# Patient Record
Sex: Male | Born: 1965 | Race: White | Hispanic: No | Marital: Single | Smoking: Current every day smoker
Health system: Southern US, Community
[De-identification: ages and names within clinical notes are randomized; demographics above are authoritative.]

## PROBLEM LIST (undated history)

## (undated) ENCOUNTER — Emergency Department (HOSPITAL_COMMUNITY): Discharge status: Against Medical Advice | Payer: 59

## (undated) DIAGNOSIS — C44300 Unspecified malignant neoplasm of skin of unspecified part of face: Secondary | ICD-10-CM

## (undated) DIAGNOSIS — Z789 Other specified health status: Secondary | ICD-10-CM

## (undated) HISTORY — PX: HERNIA REPAIR: SHX51

## (undated) HISTORY — PX: NO PAST SURGERIES: SHX2092

## (undated) HISTORY — DX: Other specified health status: Z78.9

## (undated) HISTORY — DX: Unspecified malignant neoplasm of skin of unspecified part of face: C44.300

---

## 2011-06-14 ENCOUNTER — Ambulatory Visit: Payer: Self-pay | Admitting: Physician Assistant

## 2011-06-14 ENCOUNTER — Ambulatory Visit: Payer: Self-pay | Admitting: Family Medicine

## 2011-06-14 ENCOUNTER — Ambulatory Visit: Payer: Self-pay

## 2011-06-14 VITALS — BP 132/78 | HR 60 | Temp 97.8°F | Resp 16 | Ht 70.5 in | Wt 185.8 lb

## 2011-06-14 DIAGNOSIS — M25529 Pain in unspecified elbow: Secondary | ICD-10-CM

## 2011-06-14 DIAGNOSIS — M7710 Lateral epicondylitis, unspecified elbow: Secondary | ICD-10-CM

## 2011-06-14 MED ORDER — MELOXICAM 7.5 MG PO TABS: ORAL_TABLET | ORAL | Status: DC

## 2011-06-14 MED ORDER — TENNIS ELBOW STRAP MISC: 1.0000 | Status: DC

## 2011-06-14 NOTE — Progress Notes (Signed)
Patient ID: Eric Snyder MRN: [id number], DOB: 12-15-1965, 46 y.o. Date of Encounter: 06/14/2011, 7:04 PM  Primary Physician: No primary provider on file.  Chief Complaint: Right elbow pain for 1 month  HPI: 46 y.o. year old male with history below presents with 1 month history of right elbow pain. Patient states 1 month prior he was attempting to place a motorcycle tire onto a rim and and had to strain to do so. The day after he was working on this motorcycle his right elbow was sore along the outside. Since this time he has had pain along the right lateral elbow if he goes from flexion to extension. He denies any numbness or tingling. He states that if he goes from flexion to extension the elbow will pop and become sore. He does however have full range of motion and full strength. He never had any swelling, erythema, or bruising. He is right handed. No pain proximally or distally. Has not tried anything to see if it helps. No prior injury to the affected joint.    No past medical history on file.   Home Meds: Prior to Admission medications   Not on File    Allergies: No Known Allergies  History   Social History  . Marital Status: Single    Spouse Name: N/A    Number of Children: N/A  . Years of Education: N/A   Occupational History  . Not on file.   Social History Main Topics  . Smoking status: Current Everyday Smoker -- 1.0 packs/day    Types: Cigarettes  . Smokeless tobacco: Not on file  . Alcohol Use: Not on file  . Drug Use: Not on file  . Sexually Active: Not on file   Other Topics Concern  . Not on file   Social History Narrative  . No narrative on file     Review of Systems: Constitutional: negative for chills, fever, night sweats, weight changes, or fatigue  HEENT: negative for vision changes, or hearing loss Cardiovascular: negative for chest pain or palpitations Respiratory: negative for hemoptysis, wheezing, shortness of breath, or cough Abdominal:  negative for abdominal pain, nausea, vomiting, diarrhea, or constipation Dermatological: negative for rash Neurologic: negative for headache, dizziness, or syncope All other systems reviewed and are otherwise negative with the exception to those above and in the HPI.   Physical Exam: Blood pressure 132/78, pulse 60, temperature 97.8 F (36.6 C), temperature source Oral, resp. rate 16, height 5' 10.5" (1.791 m), weight 185 lb 12.8 oz (84.278 kg)., Body mass index is 26.28 kg/(m^2). General: Well developed, well nourished, in no acute distress. Head: Normocephalic, atraumatic, eyes without discharge, sclera non-icteric, nares are without discharge.   Neck: Supple. No thyromegaly. Full ROM. No lymphadenopathy. Lungs: Clear bilaterally to auscultation without wheezes, rales, or rhonchi. Breathing is unlabored. Heart: RRR with S1 S2. No murmurs, rubs, or gallops appreciated. Msk:  Strength and tone normal for age. 5/5 strength bilateral upper extremities. Full flexion and extension of bilateral extremities without difficulty. No TTP along the olecranon, lateral epicondyle, or medial epicondyle. Although he does state that his discomfort is located along the lateral epicondyle. Brachial and radial pulses 2+ bilaterally. No TTP with squeeze. No TTP proximally or distally. Full sensation through out. No pop or clicks with flexion/extension. Pronation and supanation intact bilaterally.  Extremities/Skin: Warm and dry. No clubbing or cyanosis. No edema. No rashes or suspicious lesions. Neuro: Alert and oriented X 3. Moves all extremities spontaneously. Gait is normal. CNII-XII  grossly in tact. Psych:  Responds to questions appropriately with a normal affect.   [facility] reading (PRIMARY) by  Dr. Patsy Lager. negative  ASSESSMENT AND PLAN:  46 y.o. year old male with lateral epicondylitis  -Mobic 7.5 mg #60 1 po daily to bid prn RF 1 -Tennis elbow strap -RTC if symptoms persist   Signed, Eula Listen,  PA-C 06/14/2011 7:04 PM

## 2011-06-17 ENCOUNTER — Ambulatory Visit: Payer: Self-pay | Admitting: Family Medicine

## 2011-06-21 ENCOUNTER — Ambulatory Visit: Payer: Self-pay | Admitting: Physician Assistant

## 2012-12-15 ENCOUNTER — Encounter: Payer: Self-pay | Admitting: *Deleted

## 2012-12-19 ENCOUNTER — Ambulatory Visit (INDEPENDENT_AMBULATORY_CARE_PROVIDER_SITE_OTHER): Payer: Self-pay | Admitting: Internal Medicine

## 2012-12-19 VITALS — BP 122/82 | HR 66 | Temp 98.2°F | Resp 16 | Ht 70.5 in | Wt 183.2 lb

## 2012-12-19 DIAGNOSIS — K4090 Unilateral inguinal hernia, without obstruction or gangrene, not specified as recurrent: Secondary | ICD-10-CM

## 2012-12-19 MED ORDER — FLUTICASONE PROPIONATE 50 MCG/ACT NA SUSP: 2.0000 | Freq: Every day | NASAL | Status: DC

## 2012-12-20 NOTE — Progress Notes (Signed)
  Subjective:    Patient ID: Eric Snyder, male    DOB: 10-20-1965, 47 y.o.   MRN: [id number]  HPI Complaining of tenderness with bulging in his left lower quadrant for the last month or 2 Gets worse with lifting No testicular pain or GU symptoms Often tired Sleeps poorly/snores terribly/history of allergic problems with nasal swelling No history of asthma History of allergic rhinitis   Review of Systems No chest pain or palpitations     Objective:   Physical Exam BP 122/82  Pulse 66  Temp(Src) 98.2 F (36.8 C) (Oral)  Resp 16  Ht 5' 10.5" (1.791 m)  Wt 183 lb 3.2 oz (83.099 kg)  BMI 25.91 kg/m2  SpO2 98% Nares boggy with little airway No thyromegaly or lymphadenopathy Positive inguinal hernia on the left No testicular masses or tenderness Abdomen normal      Assessment & Plan:  Inguinal hernia - Plan: Ambulatory referral to General Surgery  fatigue secondary to poor sleep//allergic rhinitis by history--start with Flonase for one to 2 months/consider sleep study if not improved Discussed affordable Care act

## 2012-12-29 ENCOUNTER — Encounter (INDEPENDENT_AMBULATORY_CARE_PROVIDER_SITE_OTHER): Payer: Self-pay | Admitting: Surgery

## 2012-12-29 ENCOUNTER — Ambulatory Visit (INDEPENDENT_AMBULATORY_CARE_PROVIDER_SITE_OTHER): Payer: Self-pay | Admitting: Surgery

## 2012-12-29 ENCOUNTER — Telehealth (INDEPENDENT_AMBULATORY_CARE_PROVIDER_SITE_OTHER): Payer: Self-pay | Admitting: Surgery

## 2012-12-29 VITALS — BP 130/90 | HR 76 | Temp 98.2°F | Resp 15 | Ht 70.0 in | Wt 188.2 lb

## 2012-12-29 DIAGNOSIS — K4090 Unilateral inguinal hernia, without obstruction or gangrene, not specified as recurrent: Secondary | ICD-10-CM | POA: Insufficient documentation

## 2012-12-29 NOTE — Telephone Encounter (Signed)
Patient met with surgery scheduling, went over financial  responsibilities, patient will call back to schedule, face sheet placed in pending

## 2012-12-29 NOTE — Progress Notes (Signed)
Patient ID: Eric Snyder, male   DOB: 01-Oct-1965, 47 y.o.   MRN: [id number]  Chief Complaint  Patient presents with  . New Evaluation    eval [facility]    HPI Eric Snyder is a 47 y.o. male.  Patient referred by Dr. 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 He was coughing when he felt a tearing sensation in his left groin. He had minimal swelling at that time but since that occasion, he has had intermittent swelling and discomfort with a pulling sensation in his left groin. He was examined and was felt to have a left internal hernia. He is now referred for surgical evaluation.  History reviewed. No pertinent past medical history.  History reviewed. No pertinent past surgical history.  History reviewed. No pertinent family history.  Social History History  Substance Use Topics  . Smoking status: Current Every Day Smoker -- 1.00 packs/day    Types: Cigarettes  . Smokeless tobacco: Never Used  . Alcohol Use: No    No Known Allergies  No current outpatient prescriptions on file.   No current facility-administered medications for this visit.    Review of Systems Review of Systems  Constitutional: Negative for fever, chills and unexpected weight change.  HENT: Negative for congestion, hearing loss, sore throat, trouble swallowing and voice change.   Eyes: Negative for visual disturbance.  Respiratory: Negative for cough and wheezing.   Cardiovascular: Negative for chest pain, palpitations and leg swelling.  Gastrointestinal: Negative for nausea, vomiting, abdominal pain, diarrhea, constipation, blood in stool, abdominal distention, anal bleeding and rectal pain.  Genitourinary: Positive for scrotal swelling. Negative for hematuria and difficulty urinating.  Musculoskeletal: Negative for arthralgias.  Skin: Negative for rash and  wound.  Neurological: Negative for seizures, syncope, weakness and headaches.  Hematological: Negative for adenopathy. Does not bruise/bleed easily.  Psychiatric/Behavioral: Negative for confusion.    Blood pressure 130/90, pulse 76, temperature 98.2 F (36.8 C), temperature source Temporal, resp. rate 15, height 5\' 10"  (1.778 m), weight 188 lb 3.2 oz (85.367 kg).  Physical Exam Physical Exam WDWN in NAD HEENT:  EOMI, sclera anicteric Neck:  No masses, no thyromegaly Lungs:  CTA bilaterally; normal respiratory effort CV:  Regular rate and rhythm; no murmurs Abd:  +bowel sounds, soft, non-tender, no masses GU:  Bilateral descended testes with no testicular masses; left inguinal hernia that is easily reducible. No sign of righ inguinal hernia. Ext:  Well-perfused; no edema Skin:  Warm, dry; no sign of jaundice  Data Reviewed None  Assessment    Left inguinal hernia-reducible     Plan    Left inguinal hernia repair with mesh.  The surgical procedure has been discussed with the patient.  Potential risks, benefits, alternative treatments, and expected outcomes have been explained.  All of the patient's questions at this time have been answered.  The likelihood of reaching the patient's treatment goal is good.  The patient understand the proposed surgical procedure and wishes to proceed.         Danaya Geddis K. 12/29/2012, 12:28 PM

## 2013-03-14 ENCOUNTER — Other Ambulatory Visit (HOSPITAL_COMMUNITY): Payer: Self-pay | Admitting: Physical Medicine and Rehabilitation

## 2013-03-14 DIAGNOSIS — M545 Low back pain, unspecified: Secondary | ICD-10-CM

## 2013-03-22 ENCOUNTER — Ambulatory Visit (HOSPITAL_COMMUNITY)
Admission: RE | Admit: 2013-03-22 | Discharge: 2013-03-22 | Disposition: A | Discharge status: Home / Self Care | Payer: 59 | Source: Ambulatory Visit | Attending: Physical Medicine and Rehabilitation | Admitting: Physical Medicine and Rehabilitation

## 2013-03-22 DIAGNOSIS — D1779 Benign lipomatous neoplasm of other sites: Secondary | ICD-10-CM | POA: Insufficient documentation

## 2013-03-22 DIAGNOSIS — M545 Low back pain, unspecified: Secondary | ICD-10-CM

## 2013-03-22 DIAGNOSIS — M5126 Other intervertebral disc displacement, lumbar region: Secondary | ICD-10-CM | POA: Insufficient documentation

## 2013-10-16 ENCOUNTER — Encounter (HOSPITAL_COMMUNITY): Payer: Self-pay | Admitting: Emergency Medicine

## 2013-10-16 ENCOUNTER — Emergency Department (HOSPITAL_COMMUNITY)
Admission: EM | Admit: 2013-10-16 | Discharge: 2013-10-16 | Disposition: A | Discharge status: Home / Self Care | Payer: 59 | Attending: Emergency Medicine | Admitting: Emergency Medicine

## 2013-10-16 ENCOUNTER — Ambulatory Visit (INDEPENDENT_AMBULATORY_CARE_PROVIDER_SITE_OTHER): Payer: 59 | Admitting: Family Medicine

## 2013-10-16 ENCOUNTER — Emergency Department (HOSPITAL_COMMUNITY): Payer: 59

## 2013-10-16 VITALS — BP 122/82 | HR 66 | Temp 97.6°F | Resp 16 | Ht 70.25 in | Wt 185.8 lb

## 2013-10-16 DIAGNOSIS — G4489 Other headache syndrome: Secondary | ICD-10-CM | POA: Diagnosis not present

## 2013-10-16 DIAGNOSIS — R51 Headache: Secondary | ICD-10-CM | POA: Insufficient documentation

## 2013-10-16 DIAGNOSIS — R269 Unspecified abnormalities of gait and mobility: Secondary | ICD-10-CM

## 2013-10-16 DIAGNOSIS — F17200 Nicotine dependence, unspecified, uncomplicated: Secondary | ICD-10-CM | POA: Insufficient documentation

## 2013-10-16 DIAGNOSIS — Z79899 Other long term (current) drug therapy: Secondary | ICD-10-CM | POA: Diagnosis not present

## 2013-10-16 DIAGNOSIS — F29 Unspecified psychosis not due to a substance or known physiological condition: Secondary | ICD-10-CM

## 2013-10-16 DIAGNOSIS — R410 Disorientation, unspecified: Secondary | ICD-10-CM

## 2013-10-16 DIAGNOSIS — G4452 New daily persistent headache (NDPH): Secondary | ICD-10-CM

## 2013-10-16 LAB — CSF CELL COUNT WITH DIFFERENTIAL
RBC COUNT CSF: 310 /mm3 — AB
RBC Count, CSF: 1 /mm3 — ABNORMAL HIGH
Tube #: 1
Tube #: 3
WBC, CSF: 5 /mm3 (ref 0–5)
WBC, CSF: 6 /mm3 — ABNORMAL HIGH (ref 0–5)

## 2013-10-16 LAB — URINALYSIS, ROUTINE W REFLEX MICROSCOPIC
BILIRUBIN URINE: NEGATIVE
GLUCOSE, UA: NEGATIVE mg/dL
Hgb urine dipstick: NEGATIVE
Ketones, ur: NEGATIVE mg/dL
LEUKOCYTES UA: NEGATIVE
NITRITE: NEGATIVE
PH: 7 (ref 5.0–8.0)
Protein, ur: NEGATIVE mg/dL
SPECIFIC GRAVITY, URINE: 1.021 (ref 1.005–1.030)
Urobilinogen, UA: 1 mg/dL (ref 0.0–1.0)

## 2013-10-16 LAB — ETHANOL

## 2013-10-16 LAB — CBC WITH DIFFERENTIAL/PLATELET
Basophils Absolute: 0.1 10*3/uL (ref 0.0–0.1)
Basophils Relative: 1 % (ref 0–1)
Eosinophils Absolute: 0.7 10*3/uL (ref 0.0–0.7)
Eosinophils Relative: 6 % — ABNORMAL HIGH (ref 0–5)
HEMATOCRIT: 46.9 % (ref 39.0–52.0)
Hemoglobin: 16.3 g/dL (ref 13.0–17.0)
LYMPHS ABS: 3 10*3/uL (ref 0.7–4.0)
LYMPHS PCT: 24 % (ref 12–46)
MCH: 31.2 pg (ref 26.0–34.0)
MCHC: 34.8 g/dL (ref 30.0–36.0)
MCV: 89.7 fL (ref 78.0–100.0)
MONO ABS: 0.8 10*3/uL (ref 0.1–1.0)
MONOS PCT: 6 % (ref 3–12)
NEUTROS ABS: 8 10*3/uL — AB (ref 1.7–7.7)
Neutrophils Relative %: 63 % (ref 43–77)
Platelets: 229 10*3/uL (ref 150–400)
RBC: 5.23 MIL/uL (ref 4.22–5.81)
RDW: 13.1 % (ref 11.5–15.5)
WBC: 12.5 10*3/uL — AB (ref 4.0–10.5)

## 2013-10-16 LAB — RAPID URINE DRUG SCREEN, HOSP PERFORMED
AMPHETAMINES: NOT DETECTED
BARBITURATES: NOT DETECTED
BENZODIAZEPINES: NOT DETECTED
COCAINE: NOT DETECTED
Opiates: NOT DETECTED
Tetrahydrocannabinol: NOT DETECTED

## 2013-10-16 LAB — BASIC METABOLIC PANEL
Anion gap: 11 (ref 5–15)
BUN: 18 mg/dL (ref 6–23)
CHLORIDE: 102 meq/L (ref 96–112)
CO2: 27 meq/L (ref 19–32)
Calcium: 9.4 mg/dL (ref 8.4–10.5)
Creatinine, Ser: 1.03 mg/dL (ref 0.50–1.35)
GFR calc Af Amer: >90 mL/min (ref >90)
GFR calc non Af Amer: 85 mL/min — ABNORMAL LOW (ref >90)
Glucose, Bld: 84 mg/dL (ref 70–99)
Potassium: 4.3 mEq/L (ref 3.7–5.3)
Sodium: 140 mEq/L (ref 137–147)

## 2013-10-16 LAB — POCT CBC
Granulocyte percent: 60 % (ref 37–80)
HCT, POC: 47.3 % (ref 43.5–53.7)
Hemoglobin: 15.3 g/dL (ref 14.1–18.1)
Lymph, poc: 3.3 (ref 0.6–3.4)
MCH, POC: 29.4 pg (ref 27–31.2)
MCHC: 32.3 g/dL (ref 31.8–35.4)
MCV: 91.2 fL (ref 80–97)
MID (cbc): 0.5 (ref 0–0.9)
MPV: 7.9 fL (ref 0–99.8)
POC Granulocyte: 5.7 (ref 2–6.9)
POC LYMPH PERCENT: 34.9 % (ref 10–50)
POC MID %: 5.1 %M (ref 0–12)
Platelet Count, POC: 229 10*3/uL (ref 142–424)
RBC: 5.19 M/uL (ref 4.69–6.13)
RDW, POC: 13.8 %
WBC: 9.5 10*3/uL (ref 4.6–10.2)

## 2013-10-16 LAB — GRAM STAIN: Special Requests: NORMAL

## 2013-10-16 LAB — GLUCOSE, CSF: Glucose, CSF: 54 mg/dL (ref 43–76)

## 2013-10-16 LAB — PROTEIN, CSF: Total  Protein, CSF: 100 mg/dL — ABNORMAL HIGH (ref 15–45)

## 2013-10-16 LAB — POCT SEDIMENTATION RATE: POCT SED RATE: 7 mm/hr (ref 0–22)

## 2013-10-16 MED ORDER — HYDROMORPHONE HCL PF 1 MG/ML IJ SOLN
1.0000 mg | Freq: Once | INTRAMUSCULAR | Status: AC
  Administered 2013-10-16: 1 mg via INTRAVENOUS
  Filled 2013-10-16: qty 1

## 2013-10-16 MED ORDER — SODIUM CHLORIDE 0.9 % IV BOLUS (SEPSIS)
500.0000 mL | Freq: Once | INTRAVENOUS | Status: AC
  Administered 2013-10-16: 500 mL via INTRAVENOUS

## 2013-10-16 MED ORDER — SODIUM CHLORIDE 0.9 % IV SOLN
INTRAVENOUS | Status: DC
  Administered 2013-10-16: 19:00:00 via INTRAVENOUS

## 2013-10-16 MED ORDER — ONDANSETRON HCL 4 MG/2ML IJ SOLN
4.0000 mg | Freq: Once | INTRAMUSCULAR | Status: AC
  Administered 2013-10-16: 4 mg via INTRAVENOUS
  Filled 2013-10-16: qty 2

## 2013-10-16 MED ORDER — DIAZEPAM 5 MG/ML IJ SOLN
5.0000 mg | Freq: Once | INTRAMUSCULAR | Status: AC
  Administered 2013-10-16: 5 mg via INTRAVENOUS
  Filled 2013-10-16: qty 2

## 2013-10-16 NOTE — ED Provider Notes (Signed)
CSN: [id number]     Arrival date & time 10/16/13  1630 History   First MD Initiated Contact with Patient 10/16/13 1747     Chief Complaint  Patient presents with  . Headache     (Consider location/radiation/quality/duration/timing/severity/associated sxs/prior Treatment) Patient is a 48 y.o. male presenting with headaches. The history is provided by the patient and the spouse.  Headache  He presents for evaluation of headache that has been present almost constantly, for 6 days. The headache started after a bout of vomiting. First he had pain in his left neck, and then it radiated to his left hand, over the course of 2 days. Since then the pain has been persistent, and prevents him from doing things around the house, having sex;  and does not respond when he takes his medicines. He has had some chills and fever, but not taken his temperature. He has not had headaches like this previously. He denies neck pain currently. No chest pain, shortness of breath, cough, weakness or dizziness. He was seen earlier today at an urgent care, and sent here for evaluation. He denies known sick contacts. There are no other known modifying factors.  History reviewed. No pertinent past medical history. History reviewed. No pertinent past surgical history. History reviewed. No pertinent family history. History  Substance Use Topics  . Smoking status: Current Every Day Smoker -- 1.00 packs/day    Types: Cigarettes  . Smokeless tobacco: Never Used  . Alcohol Use: No    Review of Systems  Neurological: Positive for headaches.  All other systems reviewed and are negative.     Allergies  Review of patient's allergies indicates no known allergies.  Home Medications   Prior to Admission medications   Medication Sig Start Date End Date Taking? Authorizing Provider  Ascorbic Acid (VITAMIN C PO) Take 1 tablet by mouth daily.   Yes Historical Provider, MD  Cholecalciferol (VITAMIN D-3 PO) Take 1 tablet by  mouth daily.   Yes Historical Provider, MD  cyclobenzaprine (FLEXERIL) 10 MG tablet Take 10 mg by mouth at bedtime as needed for muscle spasms.   Yes Historical Provider, MD  Tapentadol HCl (NUCYNTA) 100 MG TABS Take 100 mg by mouth 2 (two) times daily as needed (pain).   Yes Historical Provider, MD  VITAMIN A PO Take 1 capsule by mouth daily.   Yes Historical Provider, MD   BP 150/103  Pulse 71  Temp(Src) 97.7 F (36.5 C) (Oral)  Resp 18  SpO2 97% Physical Exam  Nursing note and vitals reviewed. Constitutional: He is oriented to person, place, and time. He appears well-developed and well-nourished.  HENT:  Head: Normocephalic and atraumatic.  Right Ear: External ear normal.  Left Ear: External ear normal.  Mouth/Throat: No oropharyngeal exudate.  Eyes: Conjunctivae and EOM are normal. Pupils are equal, round, and reactive to light.  Neck: Normal range of motion and phonation normal. Neck supple.  No meningismus  Cardiovascular: Normal rate, regular rhythm, normal heart sounds and intact distal pulses.   Pulmonary/Chest: Effort normal and breath sounds normal. He exhibits no bony tenderness.  Abdominal: Soft. There is no tenderness.  Musculoskeletal: Normal range of motion.  Neurological: He is alert and oriented to person, place, and time. No cranial nerve deficit or sensory deficit. He exhibits normal muscle tone. Coordination normal.  No dysarthria, aphasia or nystagmus  Skin: Skin is warm, dry and intact.  Psychiatric: He has a normal mood and affect. His behavior is normal. Judgment and thought content  normal.    ED Course  LUMBAR PUNCTURE Date/Time: 10/16/2013 9:36 PM Performed by: Eulis Foster, Imoni Kohen L Authorized by: Richarda Blade Consent: Verbal consent obtained. written consent obtained. Risks and benefits: risks, benefits and alternatives were discussed Consent given by: patient Patient understanding: patient states understanding of the procedure being  performed Patient consent: the patient's understanding of the procedure matches consent given Procedure consent: procedure consent matches procedure scheduled Relevant documents: relevant documents present and verified Test results: test results available and properly labeled Site marked: the operative site was marked Imaging studies: imaging studies available Patient identity confirmed: verbally with patient, provided demographic data and hospital-assigned identification number Time out: Immediately prior to procedure a "time out" was called to verify the correct patient, procedure, equipment, support staff and site/side marked as required. Indications: evaluation for infection and evaluation for subarachnoid hemorrhage Anesthesia: local infiltration Local anesthetic: lidocaine 1% without epinephrine Anesthetic total: 3 ml Patient sedated: no Lumbar space: L3-L4 interspace Patient's position: sitting Needle gauge: 20 Needle type: diamond point Needle length: 3.5 in Number of attempts: 1 Fluid appearance: clear Tubes of fluid: 3 Total volume: 3 ml Post-procedure: site cleaned and adhesive bandage applied Patient tolerance: Patient tolerated the procedure well with no immediate complications. Comments: Transient right leg stinger on needle insertion into canal. Slow flow.   (including critical care time) Medications  0.9 %  sodium chloride infusion ( Intravenous New Bag/Given 10/16/13 1845)  sodium chloride 0.9 % bolus 500 mL (0 mLs Intravenous Stopped 10/16/13 2106)  HYDROmorphone (DILAUDID) injection 1 mg (1 mg Intravenous Given 10/16/13 1845)  diazepam (VALIUM) injection 5 mg (5 mg Intravenous Given 10/16/13 1845)  ondansetron (ZOFRAN) injection 4 mg (4 mg Intravenous Given 10/16/13 1845)    Patient Vitals for the past 24 hrs:  BP Temp Temp src Pulse Resp SpO2  10/16/13 1941 158/107 mmHg - - 69 12 98 %  10/16/13 1815 150/90 mmHg - - 58 14 98 %  10/16/13 1634 150/103 mmHg 97.7 F  (36.5 C) Oral 71 18 97 %       11:06 PM Reevaluation with update and discussion. After initial assessment and treatment, an updated evaluation reveals he continues to be very anxious, and had trouble, waiting for the test results to return. When the results return. I explained the results to the patient and his wife. They understand that the only significant abnormality on the lumbar puncture was elevated protein. This is a nonspecific finding and in this case does not indicate an infectious meningitis. Kismet Facemire L     Labs Review Labs Reviewed  CBC WITH DIFFERENTIAL  BASIC METABOLIC PANEL  URINALYSIS, ROUTINE W REFLEX MICROSCOPIC  URINE RAPID DRUG SCREEN (HOSP PERFORMED)  ETHANOL    Imaging Review Ct Head Wo Contrast  10/16/2013   CLINICAL DATA:  Headache and dizziness  EXAM: CT HEAD WITHOUT CONTRAST  TECHNIQUE: Contiguous axial images were obtained from the base of the skull through the vertex without intravenous contrast.  COMPARISON:  None.  FINDINGS: The ventricles are normal in size and configuration. There is no mass, hemorrhage, extra-axial fluid collection, midline shift. Gray-white compartments are normal. There is no demonstrable acute infarct. Bony calvarium appears intact. The mastoid air cells are clear. There is paranasal sinus disease in the right maxillary antrum. There is leftward deviation of the nasal septum.  IMPRESSION: Right maxillary sinus disease. No intracranial mass, hemorrhage, extra-axial fluid collection, or focal gray -white compartment lesion. No acute infarct apparent.   Electronically Signed   By:  Lowella Grip M.D.   On: 10/16/2013 17:22     EKG Interpretation None      MDM   Final diagnoses:  Other headache syndrome    Nonspecific headache, with ED evaluation, not indicating presence of intracranial bleeding, viral meningitis, bacterial meningitis, or acute brain lesion.  Nursing Notes Reviewed/ Care Coordinated Applicable Imaging  Reviewed Interpretation of Laboratory Data incorporated into ED treatment  The patient appears reasonably screened and/or stabilized for discharge and I doubt any other medical condition or other [facility] requiring further screening, evaluation, or treatment in the ED at this time prior to discharge.  Plan: Home Medications- usual; Home Treatments- rest; return here if the recommended treatment, does not improve the symptoms; Recommended follow up- PCP f/u prn    Richarda Blade, MD 10/16/13 2308

## 2013-10-16 NOTE — ED Notes (Signed)
Security at bedside to calm patient and assist with discharge.

## 2013-10-16 NOTE — ED Notes (Signed)
Pt yelling out at nurses station. States "I never am in the hospital, I never am sick, I don't want to be here take these damn wires off of me" All monitors taken off patient per his request to calm him down from yelling and cursing at his wife and staff.

## 2013-10-16 NOTE — Progress Notes (Signed)
Chief Complaint:  Chief Complaint  Patient presents with  . Headache    Pt. has a terrible headache in the middle of his head, x6 days, balance is off while going down the stairs but not up stairs  . Neck Pain    HPI: Eric Snyder is a 48 y.o. male who is here for a 6 day history of worsening HA,  Started in the back of his head on the left side, he states he had eaten a salad and had some nausea and vomited x 1 . The intense vomiting may have pulled the back of his neck muscle and he thought that was where his pain came from and it would go away. However the pain is now constant , intense and severe with minimal exertion. For the first 2 days he was having pain and felt there was a little bubble and it really would sting around his occiput  and now it is behind his ear and also on his left side pf his temples , he currently has pain with any type of exertion whether it is climbing up stairs or having sex. He states that it starts at the base of his skull and then radiates to the left temporal and front part of his skull. He has had chills and Fever Tmax 101 , fever last night and 2 days before that. He has pressure in his head, he feels like something is in his head. Per his wife he has had some numbness and tingling down his right arm, also he has had some confusion ,he does not remember specifics, he has had some blurred vision.  He feels somethng burst behind his left ear drum.  He feels somethng burst behind his left ear drum. He deneis any recent history of migraine HA, he had them as a child.  He has some blurred vision. No double vision. Some light and noise sensitivity He states he has some numbenss and tinglin on right arm when he was sleeping on it and also when he was driving.  HE denies dizziness, deneis CP, SOB.  He does not have a hsitory of hyperlipidemia, HTN or DM. He is a 1 ppd x 20 year smoker No prior hx of MI/CVA  History reviewed. No pertinent past medical  history. History reviewed. No pertinent past surgical history. History   Social History  . Marital Status: Married    Spouse Name: N/A    Number of Children: N/A  . Years of Education: N/A   Social History Main Topics  . Smoking status: Current Every Day Smoker -- 1.00 packs/day    Types: Cigarettes  . Smokeless tobacco: Never Used  . Alcohol Use: No  . Drug Use: No  . Sexual Activity: None   Other Topics Concern  . None   Social History Narrative  . None   History reviewed. No pertinent family history. No Known Allergies Prior to Admission medications   Not on File     ROS: The patient denies night sweats, unintentional weight loss, chest pain, palpitations, wheezing, dyspnea on exertion,  vomiting, abdominal pain, dysuria, hematuria, melena, + numbness, weakness, or tingling.   All other systems have been reviewed and were otherwise negative with the exception of those mentioned in the HPI and as above.    PHYSICAL EXAM: Filed Vitals:   10/16/13 1346  BP: 122/82  Pulse: 66  Temp: 97.6 F (36.4 C)  Resp: 16   Filed Vitals:  10/16/13 1346  Height: 5' 10.25" (1.784 m)  Weight: 185 lb 12.8 oz (84.278 kg)   Body mass index is 26.48 kg/(m^2).  General: Alert, mild distress HEENT:  Normocephalic, atraumatic, oropharynx patent. EOMI, PERRLA, fundo is normal, tm normal; his eyes are closed. + tenderness along left occiput and also left temporal area Cardiovascular:  Regular rate and rhythm, no rubs murmurs or gallops.  No Carotid bruits, radial pulse intact. No pedal edema.  Respiratory: Clear to auscultation bilaterally.  No wheezes, rales, or rhonchi.  No cyanosis, no use of accessory musculature GI: No organomegaly, abdomen is soft and non-tender, positive bowel sounds.  No masses. Skin: No rashes. Neurologic: Facial musculature symmetric. He has 5/5 strength UE and Mcclellan Demarais . Neg meningeal signs.  Psychiatric: Patient is appropriate throughout our  interaction. Lymphatic: No cervical lymphadenopathy Musculoskeletal: Gait is intact Neck-normal AROM, neg spurling Shoulder exam WNL   LABS: Results for orders placed in visit on 10/16/13  POCT CBC      Result Value Ref Range   WBC 9.5  4.6 - 10.2 K/uL   Lymph, poc 3.3  0.6 - 3.4   POC LYMPH PERCENT 34.9  10 - 50 %L   MID (cbc) 0.5  0 - 0.9   POC MID % 5.1  0 - 12 %M   POC Granulocyte 5.7  2 - 6.9   Granulocyte percent 60.0  37 - 80 %G   RBC 5.19  4.69 - 6.13 M/uL   Hemoglobin 15.3  14.1 - 18.1 g/dL   HCT, POC 47.3  43.5 - 53.7 %   MCV 91.2  80 - 97 fL   MCH, POC 29.4  27 - 31.2 pg   MCHC 32.3  31.8 - 35.4 g/dL   RDW, POC 13.8     Platelet Count, POC 229  142 - 424 K/uL   MPV 7.9  0 - 99.8 fL     EKG/XRAY:   Primary read interpreted by Dr. Marin Comment at [facility].   ASSESSMENT/PLAN: Encounter Diagnoses  Name Primary?  . New daily persistent headache Yes  . Intermittent confusion   . Gait abnormality    48 y/o male with no significant PMH except 1 ppd tobacco user for 20 years with worsening occipital HA radiating to temporal lobe  Etiology ? Atypical migraine vs  neck strain vs CVA/anuerysm vs possible temporal arteritis He has neuro sxs that need to be evaluated with worsening HA and some confusion and gait changes Since he is stoic per wife and never goes to the doctor this was serious enough for him to come in, we attempted to get imaging to ruleout CVA/aneurysm but was unsuccessful Labs pending Go to ER for further evaluation  F/u prn    Gross sideeffects, risk and benefits, and alternatives of medications d/w patient. Patient is aware that all medications have potential sideeffects and we are unable to predict every sideeffect or drug-drug interaction that may occur.  Carla Whilden, [place], DO 10/16/2013 3:16 PM

## 2013-10-16 NOTE — ED Notes (Signed)
The patient is unable to give a urine specimen at this time. 

## 2013-10-16 NOTE — ED Notes (Signed)
Pt screaming and yelling at nurse and wife. States he is going home, he "does not have meningitis" and wants to go. Pt rambling and irate. Pt IV removed per pt request.

## 2013-10-16 NOTE — Discharge Instructions (Signed)
Follow up with your doctor for further evaluation of the headache. The testing today; did not indicate meningitis, bleeding in your head or suggest any signs of aneurysms, in the brain.   General Headache Without Cause A headache is pain or discomfort felt around the head or neck area. The specific cause of a headache may not be found. There are many causes and types of headaches. A few common ones are:  Tension headaches.  Migraine headaches.  Cluster headaches.  Chronic daily headaches. HOME CARE INSTRUCTIONS   Keep all follow-up appointments with your caregiver or any specialist referral.  Only take over-the-counter or prescription medicines for pain or discomfort as directed by your caregiver.  Lie down in a dark, quiet room when you have a headache.  Keep a headache journal to find out what may trigger your migraine headaches. For example, write down:  What you eat and drink.  How much sleep you get.  Any change to your diet or medicines.  Try massage or other relaxation techniques.  Put ice packs or heat on the head and neck. Use these 3 to 4 times per day for 15 to 20 minutes each time, or as needed.  Limit stress.  Sit up straight, and do not tense your muscles.  Quit smoking if you smoke.  Limit alcohol use.  Decrease the amount of caffeine you drink, or stop drinking caffeine.  Eat and sleep on a regular schedule.  Get 7 to 9 hours of sleep, or as recommended by your caregiver.  Keep lights dim if bright lights bother you and make your headaches worse. SEEK MEDICAL CARE IF:   You have problems with the medicines you were prescribed.  Your medicines are not working.  You have a change from the usual headache.  You have nausea or vomiting. SEEK IMMEDIATE MEDICAL CARE IF:   Your headache becomes severe.  You have a fever.  You have a stiff neck.  You have loss of vision.  You have muscular weakness or loss of muscle control.  You start  losing your balance or have trouble walking.  You feel faint or pass out.  You have severe symptoms that are different from your first symptoms. MAKE SURE YOU:   Understand these instructions.  Will watch your condition.  Will get help right away if you are not doing well or get worse. Document Released: 02/08/2005 Document Revised: 05/03/2011 Document Reviewed: 02/24/2011 [facility] Patient Information 2015 [place], [place]. This information is not intended to replace advice given to you by your health care provider. Make sure you discuss any questions you have with your health care provider.

## 2013-10-16 NOTE — ED Notes (Addendum)
Pt reports straining to have a bowel movement 5-6 days ago and felt "something pop" at the base of his skull, now has pain to left side of head and blurred vision left eye. Denies any n/v. Pt went to [facility], had labs drawn and then sent here.

## 2013-10-16 NOTE — ED Notes (Signed)
Pt very anxious, speaking angrily about how his head hurts. Pt is also upset because he cannot have sex, states every time he tries his head hurts.

## 2013-10-17 ENCOUNTER — Telehealth: Payer: Self-pay

## 2013-10-17 ENCOUNTER — Telehealth: Payer: Self-pay | Admitting: Family Medicine

## 2013-10-17 LAB — COMPLETE METABOLIC PANEL WITHOUT GFR
AST: 22 U/L (ref 0–37)
Calcium: 9.4 mg/dL (ref 8.4–10.5)
Chloride: 104 meq/L (ref 96–112)
Creat: 0.96 mg/dL (ref 0.50–1.35)
Potassium: 4.4 meq/L (ref 3.5–5.3)

## 2013-10-17 LAB — COMPLETE METABOLIC PANEL WITH GFR
ALT: 21 U/L (ref 0–53)
Albumin: 4.4 g/dL (ref 3.5–5.2)
Alkaline Phosphatase: 50 U/L (ref 39–117)
BUN: 19 mg/dL (ref 6–23)
CO2: 26 mEq/L (ref 19–32)
GFR, Est African American: >89 mL/min
GFR, Est Non African American: >89 mL/min
Glucose, Bld: 88 mg/dL (ref 70–99)
Sodium: 138 mEq/L (ref 135–145)
Total Bilirubin: 0.4 mg/dL (ref 0.2–1.2)
Total Protein: 6.7 g/dL (ref 6.0–8.3)

## 2013-10-17 NOTE — Telephone Encounter (Signed)
Patient is returning dr Marin Comment call from earlier today

## 2013-10-17 NOTE — Telephone Encounter (Signed)
LM about labs , apparently when he got into Er they did a spinal tap. CT scan was negative for  CVA/lesions. He has right max sinus diseases. He was also given valium, dilaudid.

## 2013-10-18 NOTE — Telephone Encounter (Signed)
LM for pt- advised scan was normal. Rtn call if any questions.

## 2013-10-19 ENCOUNTER — Ambulatory Visit (INDEPENDENT_AMBULATORY_CARE_PROVIDER_SITE_OTHER): Payer: 59 | Admitting: Internal Medicine

## 2013-10-19 VITALS — BP 126/80 | HR 67 | Temp 98.2°F | Resp 17 | Ht 71.0 in | Wt 187.0 lb

## 2013-10-19 DIAGNOSIS — K089 Disorder of teeth and supporting structures, unspecified: Secondary | ICD-10-CM

## 2013-10-19 DIAGNOSIS — K0889 Other specified disorders of teeth and supporting structures: Secondary | ICD-10-CM

## 2013-10-19 DIAGNOSIS — G4452 New daily persistent headache (NDPH): Secondary | ICD-10-CM

## 2013-10-19 DIAGNOSIS — Z8349 Family history of other endocrine, nutritional and metabolic diseases: Secondary | ICD-10-CM

## 2013-10-19 DIAGNOSIS — J3089 Other allergic rhinitis: Secondary | ICD-10-CM

## 2013-10-19 MED ORDER — PREDNISONE 20 MG PO TABS: ORAL_TABLET | ORAL | Status: DC

## 2013-10-19 NOTE — Progress Notes (Signed)
   Subjective:    Patient ID: Eric Snyder, male    DOB: [birth year], 48 y.o.   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 Authorizing Provider  Ascorbic Acid (VITAMIN C PO) Take 1 tablet by mouth daily.   Yes Historical Provider, MD  Cholecalciferol (VITAMIN D-3 PO) Take 1 tablet by mouth daily.   Yes Historical Provider, MD  cyclobenzaprine (FLEXERIL) 10 MG tablet Take 10 mg by mouth at bedtime as needed for muscle spasms.   Yes Historical Provider, MD  VITAMIN A PO Take 1 capsule by mouth daily.   Yes Historical Provider, MD  NUCYNTA-   -Dr Marjean Donna rupture and bulging   Review of Systems No fever chills sweats No vision changes No sob No paresthias No confusion Not dizzy    Objective:   Physical Exam BP 126/80  Pulse 67  Temp(Src) 98.2 F (36.8 C) (Oral)  Resp 17  Ht 5\' 11"  (1.803 m)  Wt 187 lb (84.823 kg)  BMI 26.09 kg/m2  SpO2 98%  HEENT: PERRLA/EOMs conj  nose clear x boggy turbs Lungs clear Ht reg w/out M CN 2-12 intact DTRs symmetr No sens or motor losses Romberg negative Gait nl     Assessment & Plan:  New daily persistent headache  Family history of hemochromatosis - Plan: Hemochromatosis DNA-PCR(c282y,h63d)  Other allergic rhinitis  Pain, dental  The cause of his symptoms related to his occupational exposures and no signs of at least one sinus area with fluid on the CT it would be prudent to treat aggressively for allergies and see any effect on his  headache He had a round of antibiotics from the dentist and so this will not be repeated The dental injections may have played a role  Meds ordered this encounter  Medications  . predniSONE (DELTASONE) 20 MG tablet    Sig: 3/3/2/2/1/1 single daily dose for 6 days    Dispense:  12 tablet    Refill:  0   OTC antihistamines/decongestants

## 2013-10-20 LAB — CSF CULTURE W GRAM STAIN

## 2013-10-20 LAB — CSF CULTURE: CULTURE: NO GROWTH

## 2013-10-24 LAB — HEMOCHROMATOSIS DNA-PCR(C282Y,H63D)

## 2013-10-25 ENCOUNTER — Encounter: Payer: Self-pay | Admitting: Internal Medicine

## 2014-01-22 ENCOUNTER — Other Ambulatory Visit: Payer: Self-pay

## 2014-01-30 ENCOUNTER — Ambulatory Visit: Payer: 59

## 2014-01-30 ENCOUNTER — Ambulatory Visit (INDEPENDENT_AMBULATORY_CARE_PROVIDER_SITE_OTHER): Payer: 59

## 2014-01-30 DIAGNOSIS — Z23 Encounter for immunization: Secondary | ICD-10-CM

## 2014-10-08 ENCOUNTER — Telehealth: Payer: Self-pay

## 2014-10-08 ENCOUNTER — Ambulatory Visit (INDEPENDENT_AMBULATORY_CARE_PROVIDER_SITE_OTHER): Payer: 59 | Admitting: Internal Medicine

## 2014-10-08 VITALS — BP 128/74 | HR 73 | Temp 98.6°F | Resp 18 | Ht 71.0 in | Wt 186.0 lb

## 2014-10-08 DIAGNOSIS — K4090 Unilateral inguinal hernia, without obstruction or gangrene, not specified as recurrent: Secondary | ICD-10-CM

## 2014-10-08 NOTE — Progress Notes (Signed)
   Subjective:  This chart was scribed for Eric Lin, MD by [facility], medical scribe at [facility].The patient was seen in exam room 14 and the patient's care was started at 5:54 PM.   Patient ID: Eric Snyder, male    DOB: August 20, 1965, 49 y.o.   MRN: [id number] Chief Complaint  Patient presents with  . Referral    hernia   HPI HPI Comments: Eric Snyder is a 49 y.o. male who presents to [facility] for a referral for a hernia. He was previously referred to Dr. Georgette Dover in 2014 and he would like to be referred to him again.   History reviewed. No pertinent past medical history. Prior to Admission medications   Medication Sig Start Date End Date Taking? Authorizing Provider  Ascorbic Acid (VITAMIN C PO) Take 1 tablet by mouth daily.   Yes Historical Provider, MD  Cholecalciferol (VITAMIN D-3 PO) Take 1 tablet by mouth daily.   Yes Historical Provider, MD  cyclobenzaprine (FLEXERIL) 10 MG tablet Take 10 mg by mouth at bedtime as needed for muscle spasms.   Yes Historical Provider, MD  Diclofenac Sodium (VOLTAREN PO) Take by mouth.   Yes Historical Provider, MD  Tapentadol HCl (NUCYNTA) 100 MG TABS Take 100 mg by mouth 2 (two) times daily as needed (pain).   Yes Historical Provider, MD  VITAMIN A PO Take 1 capsule by mouth daily.   Yes Historical Provider, MD  predniSONE (DELTASONE) 20 MG tablet 3/3/2/2/1/1 single daily dose for 6 days Patient not taking: Reported on 10/08/2014 10/19/13   Leandrew Koyanagi, MD   No Known Allergies  Review of Systems     Objective:  BP 128/74 mmHg  Pulse 73  Temp(Src) 98.6 F (37 C)  Resp 18  Ht 5\' 11"  (1.803 m)  Wt 186 lb (84.369 kg)  BMI 25.95 kg/m2  SpO2 98% Physical Exam  Constitutional: He is oriented to person, place, and time. He appears well-developed and well-nourished. No distress.  HENT:  Head: Normocephalic and atraumatic.  Eyes: Pupils are equal, round, and reactive to light.    Neck: Normal range of motion.  Cardiovascular: Normal rate and regular rhythm.   Pulmonary/Chest: Effort normal. No respiratory distress.  Genitourinary:  L inguinal bulge above the ligament //tender /reducible/larger than past exam  Musculoskeletal: Normal range of motion.  Neurological: He is alert and oriented to person, place, and time.  Skin: Skin is warm and dry.  Psychiatric: He has a normal mood and affect. His behavior is normal.  Nursing note and vitals reviewed.     Assessment & Plan:  Inguinal hernia without obstruction or gangrene, recurrence not specified, unspecified laterality - Plan: Ambulatory referral to General Surgery     I have completed the patient encounter in its entirety as documented by the scribe, with editing by me where necessary. Akiah Bauch P. Laney Pastor, M.D.

## 2014-10-08 NOTE — Telephone Encounter (Signed)
Patient's wife called to ask if a referral could be made for her husband regarding his hernia.  He was referred in 2014 to Dr. Georgette Dover, and he would like to be referred to him again.  I advised his wife that he may need to come in to be seen since it has been a while since he's been to our office.  Please advise.  CB#: [phone]

## 2014-10-08 NOTE — Telephone Encounter (Signed)
LMOM to RTC for eval for referral.

## 2014-10-09 ENCOUNTER — Encounter: Payer: Self-pay | Admitting: General Surgery

## 2014-10-10 ENCOUNTER — Other Ambulatory Visit: Payer: Self-pay

## 2014-10-16 ENCOUNTER — Ambulatory Visit (INDEPENDENT_AMBULATORY_CARE_PROVIDER_SITE_OTHER): Payer: 59 | Admitting: General Surgery

## 2014-10-16 ENCOUNTER — Encounter: Payer: Self-pay | Admitting: General Surgery

## 2014-10-16 VITALS — BP 125/81 | HR 64 | Temp 98.1°F | Ht 70.0 in | Wt 187.0 lb

## 2014-10-16 DIAGNOSIS — K4000 Bilateral inguinal hernia, with obstruction, without gangrene, not specified as recurrent: Secondary | ICD-10-CM | POA: Diagnosis not present

## 2014-10-16 DIAGNOSIS — K4020 Bilateral inguinal hernia, without obstruction or gangrene, not specified as recurrent: Secondary | ICD-10-CM | POA: Insufficient documentation

## 2014-10-16 NOTE — Progress Notes (Signed)
Surgical Consultation  10/16/2014  [name] is an 49 y.o. male.   Chief Complaint  Patient presents with  . Other    Consult-Right Inguinal Hernia     HPI: 49 year old male presents to clinic for evaluation of groin pain. He states that he's been having left groin pain for approximately 18 months that has been progressively getting worse. Torsed for certain positions and after having a stressful or active active day. Tries the pain as a dull pressure that is constant. It does not probably does not stab. Sometimes it shoots down into the scrotum and on occasion he feels it on the opposite side. He states that he has on occasion noticed a bulge in his left groin that he has had to reduce. He has never noticed a bulge on his right side. The bulge has never been stuck out, it has always reduced on its own when he lays flat. He denies any fevers, chills, sweats, shortness of breath, chest pain, nausea, vomiting, constipation, diarrhea. Outside of this groin pain he states his only other problem is his back pain for which she sees a back specialist for.  Past Medical History  Diagnosis Date  . Right inguinal hernia     History reviewed. No pertinent past surgical history.  History reviewed. No pertinent family history.  Social History:  reports that he has been smoking Cigarettes.  He has a 30 pack-year smoking history. He has never used smokeless tobacco. He reports that he does not drink alcohol or use illicit drugs.  Allergies: No Known Allergies  Medications reviewed.     ROS multisystem review of systems was completed. All pertinent positives and negatives were within the history of present illness the remainder were negative.     BP 125/81 mmHg  Pulse 64  Temp(Src) 98.1 F (36.7 C) (Oral)  Ht 5\' 10"  (1.778 m)  Wt 84.823 kg (187 lb)  BMI 26.83 kg/m2  Physical Exam  Constitutional: He is oriented to person, place, and time and well-developed, well-nourished, and in  no distress.  HENT:  Head: Normocephalic and atraumatic.  Eyes: Conjunctivae are normal. Pupils are equal, round, and reactive to light.  Neck: Normal range of motion.  Cardiovascular: Normal rate, regular rhythm and normal heart sounds.   Pulmonary/Chest: Effort normal and breath sounds normal.  Abdominal: Soft. Bowel sounds are normal. He exhibits no distension. There is tenderness. A hernia is present. Hernia confirmed positive in the right inguinal area and confirmed positive in the left inguinal area.  Genitourinary: Penis normal.  Musculoskeletal: Normal range of motion.  Neurological: He is alert and oriented to person, place, and time. Gait normal.  Skin: Skin is warm and dry.  Psychiatric: Affect and judgment normal.      No results found for this or any previous visit (from the past 48 hour(s)). No results found.  Assessment/Plan: 1. Bilateral inguinal hernia with obstruction and without gangrene, recurrence not specified 49 year old male with bilateral internal hernias. Both of which are soft and easily reducible. Patient was offered open versus laparoscopic bilateral inguinal hernia repairs. The benefits risks and alternatives for both were discussed in detail and the patient voiced understanding. Patient looks undergo a laparoscopic bilateral hernia repair, scheduled for Thursday, September 8.  I have explained the procedure, risks, and aftercare of inguinal hernia repair to Eric Snyder.   Risks include but are not limited to bleeding, infection, wound problems, anesthesia, recurrence, bladder or intestine injury, urinary retention, testicular dysfunction, chronic pain, mesh problems.  He  seems to understand and agrees to proceed.  Questions were answered to his stated satisfaction.    [name], MD [facility] General Surgeon [facility] 10/16/2014

## 2014-10-16 NOTE — Patient Instructions (Signed)
Our surgery coordinator will be contacting you to go over your surgery.

## 2014-10-17 ENCOUNTER — Other Ambulatory Visit: Payer: Self-pay | Admitting: General Surgery

## 2014-10-18 ENCOUNTER — Telehealth: Payer: Self-pay | Admitting: General Surgery

## 2014-10-18 NOTE — Telephone Encounter (Signed)
Pt advised of pre op date/time and sx date. Sx: 10/31/14 with Dr Samuel Germany bilateral ing hernia repair Pre op: 10/24/14 bw 1-5.--phone.

## 2014-10-24 ENCOUNTER — Encounter: Payer: Self-pay | Admitting: *Deleted

## 2014-10-24 ENCOUNTER — Other Ambulatory Visit: Payer: 59

## 2014-10-24 NOTE — Patient Instructions (Signed)
  Your procedure is scheduled on: 10-31-14 Report to [place]. To find out your arrival time please call [phone] between 1PM - 3PM on 10-30-14  Remember: Instructions that are not followed completely may result in serious medical risk, up to and including death, or upon the discretion of your surgeon and anesthesiologist your surgery may need to be rescheduled.    _X___ 1. Do not eat food or drink liquids after midnight. No gum chewing or hard candies.     _X___ 2. No Alcohol for 24 hours before or after surgery.   ____ 3. Bring all medications with you on the day of surgery if instructed.    ____ 4. Notify your doctor if there is any change in your medical condition     (cold, fever, infections).     Do not wear jewelry, make-up, hairpins, clips or nail polish.  Do not wear lotions, powders, or perfumes. You may wear deodorant.  Do not shave 48 hours prior to surgery. Men may shave face and neck.  Do not bring valuables to the hospital.    [facility] is not responsible for any belongings or valuables.               Contacts, dentures or bridgework may not be worn into surgery.  Leave your suitcase in the car. After surgery it may be brought to your room.  For patients admitted to the hospital, discharge time is determined by your treatment team.   Patients discharged the day of surgery will not be allowed to drive home.   Please read over the following fact sheets that you were given:    ____ Take these medicines the morning of surgery with A SIP OF WATER:    1. NONE  2.   3.   4.  5.  6.  ____ Fleet Enema (as directed)   ____ Use CHG Soap as directed  ____ Use inhalers on the day of surgery  ____ Stop metformin 2 days prior to surgery    ____ Take 1/2 of usual insulin dose the night before surgery and none on the morning of surgery.   ____ Stop Coumadin/Plavix/aspirin-N/A  _X___ Stop Anti-inflammatories-STOP DICLOFENAC 7 DAYS  PRIOR TO SURGERY-NO NSAIDS OR ASA PRODUCTS-NUCYNTA/TYLENOL OK   ____ Stop supplements until after surgery.    ____ Bring C-Pap to the hospital.

## 2014-10-31 ENCOUNTER — Telehealth: Payer: Self-pay | Admitting: General Surgery

## 2014-10-31 ENCOUNTER — Ambulatory Visit: Payer: 59 | Admitting: Anesthesiology

## 2014-10-31 ENCOUNTER — Encounter
Admission: RE | Disposition: A | Discharge status: Home / Self Care | Payer: Self-pay | Source: Ambulatory Visit | Attending: General Surgery

## 2014-10-31 ENCOUNTER — Encounter: Payer: Self-pay | Admitting: *Deleted

## 2014-10-31 ENCOUNTER — Ambulatory Visit
Admission: RE | Admit: 2014-10-31 | Discharge: 2014-10-31 | Disposition: A | Discharge status: Home / Self Care | Payer: 59 | Source: Ambulatory Visit | Attending: General Surgery | Admitting: General Surgery

## 2014-10-31 DIAGNOSIS — R1030 Lower abdominal pain, unspecified: Secondary | ICD-10-CM | POA: Insufficient documentation

## 2014-10-31 DIAGNOSIS — D176 Benign lipomatous neoplasm of spermatic cord: Secondary | ICD-10-CM | POA: Insufficient documentation

## 2014-10-31 DIAGNOSIS — F17210 Nicotine dependence, cigarettes, uncomplicated: Secondary | ICD-10-CM | POA: Insufficient documentation

## 2014-10-31 DIAGNOSIS — K4000 Bilateral inguinal hernia, with obstruction, without gangrene, not specified as recurrent: Secondary | ICD-10-CM

## 2014-10-31 DIAGNOSIS — K4020 Bilateral inguinal hernia, without obstruction or gangrene, not specified as recurrent: Secondary | ICD-10-CM | POA: Diagnosis present

## 2014-10-31 HISTORY — PX: INGUINAL HERNIA REPAIR: SHX194

## 2014-10-31 SURGERY — REPAIR, HERNIA, INGUINAL, LAPAROSCOPIC
Anesthesia: General | Laterality: Bilateral | Wound class: Clean

## 2014-10-31 MED ORDER — FENTANYL CITRATE (PF) 100 MCG/2ML IJ SOLN
INTRAMUSCULAR | Status: DC | PRN
  Administered 2014-10-31: 50 ug via INTRAVENOUS
  Administered 2014-10-31 (×2): 100 ug via INTRAVENOUS
  Administered 2014-10-31 (×2): 25 ug via INTRAVENOUS

## 2014-10-31 MED ORDER — ROCURONIUM BROMIDE 100 MG/10ML IV SOLN
INTRAVENOUS | Status: DC | PRN
  Administered 2014-10-31: 5 mg via INTRAVENOUS
  Administered 2014-10-31: 30 mg via INTRAVENOUS
  Administered 2014-10-31: 20 mg via INTRAVENOUS
  Administered 2014-10-31: 10 mg via INTRAVENOUS

## 2014-10-31 MED ORDER — LIDOCAINE HCL (CARDIAC) 20 MG/ML IV SOLN
INTRAVENOUS | Status: DC | PRN
  Administered 2014-10-31: 60 mg via INTRAVENOUS

## 2014-10-31 MED ORDER — FENTANYL CITRATE (PF) 100 MCG/2ML IJ SOLN: 25.0000 ug | INTRAMUSCULAR | Status: DC | PRN

## 2014-10-31 MED ORDER — IPRATROPIUM-ALBUTEROL 0.5-2.5 (3) MG/3ML IN SOLN
3.0000 mL | RESPIRATORY_TRACT | Status: DC
  Administered 2014-10-31: 3 mL via RESPIRATORY_TRACT

## 2014-10-31 MED ORDER — ACETAMINOPHEN 10 MG/ML IV SOLN
INTRAVENOUS | Status: AC
  Filled 2014-10-31: qty 100

## 2014-10-31 MED ORDER — OXYCODONE-ACETAMINOPHEN 7.5-325 MG PO TABS: 1.0000 | ORAL_TABLET | ORAL | Status: DC | PRN

## 2014-10-31 MED ORDER — DOCUSATE SODIUM 100 MG PO CAPS: 100.0000 mg | ORAL_CAPSULE | Freq: Two times a day (BID) | ORAL | Status: DC

## 2014-10-31 MED ORDER — CHLORHEXIDINE GLUCONATE 4 % EX LIQD: 1.0000 "application " | Freq: Once | CUTANEOUS | Status: DC

## 2014-10-31 MED ORDER — LIDOCAINE HCL (PF) 1 % IJ SOLN
INTRAMUSCULAR | Status: AC
  Filled 2014-10-31: qty 30

## 2014-10-31 MED ORDER — BUPIVACAINE-EPINEPHRINE (PF) 0.25% -1:200000 IJ SOLN
INTRAMUSCULAR | Status: AC
  Filled 2014-10-31: qty 30

## 2014-10-31 MED ORDER — ONDANSETRON HCL 4 MG/2ML IJ SOLN: 4.0000 mg | Freq: Once | INTRAMUSCULAR | Status: DC | PRN

## 2014-10-31 MED ORDER — KETOROLAC TROMETHAMINE 30 MG/ML IJ SOLN
INTRAMUSCULAR | Status: DC | PRN
  Administered 2014-10-31: 30 mg via INTRAVENOUS

## 2014-10-31 MED ORDER — LIDOCAINE HCL 1 % IJ SOLN
INTRAMUSCULAR | Status: DC | PRN
  Administered 2014-10-31: 4 mL
  Administered 2014-10-31: 10 mL

## 2014-10-31 MED ORDER — OXYCODONE-ACETAMINOPHEN 7.5-325 MG PO TABS
1.0000 | ORAL_TABLET | ORAL | Status: DC | PRN
  Administered 2014-10-31: 1 via ORAL

## 2014-10-31 MED ORDER — GLYCOPYRROLATE 0.2 MG/ML IJ SOLN
INTRAMUSCULAR | Status: DC | PRN
  Administered 2014-10-31: 0.2 mg via INTRAVENOUS

## 2014-10-31 MED ORDER — FAMOTIDINE 20 MG PO TABS
ORAL_TABLET | ORAL | Status: AC
  Administered 2014-10-31: 20 mg via ORAL
  Filled 2014-10-31: qty 1

## 2014-10-31 MED ORDER — ONDANSETRON HCL 4 MG/2ML IJ SOLN: INTRAMUSCULAR | Status: DC | PRN

## 2014-10-31 MED ORDER — LACTATED RINGERS IV SOLN
INTRAVENOUS | Status: DC
Start: 2014-10-31 — End: 2014-10-31
  Administered 2014-10-31: 08:00:00 via INTRAVENOUS

## 2014-10-31 MED ORDER — BUPIVACAINE HCL (PF) 0.5 % IJ SOLN
INTRAMUSCULAR | Status: DC | PRN
  Administered 2014-10-31: 10 mL
  Administered 2014-10-31: 4 mL

## 2014-10-31 MED ORDER — CEFAZOLIN SODIUM-DEXTROSE 2-3 GM-% IV SOLR
2.0000 g | INTRAVENOUS | Status: AC
  Administered 2014-10-31: 2 g via INTRAVENOUS

## 2014-10-31 MED ORDER — CEFAZOLIN SODIUM-DEXTROSE 2-3 GM-% IV SOLR
INTRAVENOUS | Status: AC
  Administered 2014-10-31: 2 g via INTRAVENOUS
  Filled 2014-10-31: qty 50

## 2014-10-31 MED ORDER — OXYCODONE-ACETAMINOPHEN 7.5-325 MG PO TABS
ORAL_TABLET | ORAL | Status: AC
  Administered 2014-10-31: 1 via ORAL
  Filled 2014-10-31: qty 1

## 2014-10-31 MED ORDER — FAMOTIDINE 20 MG PO TABS
20.0000 mg | ORAL_TABLET | Freq: Once | ORAL | Status: AC
  Administered 2014-10-31: 20 mg via ORAL

## 2014-10-31 MED ORDER — BUPIVACAINE HCL (PF) 0.5 % IJ SOLN
INTRAMUSCULAR | Status: AC
  Filled 2014-10-31: qty 30

## 2014-10-31 MED ORDER — ONDANSETRON 4 MG PO TBDP: 4.0000 mg | ORAL_TABLET | Freq: Three times a day (TID) | ORAL | Status: DC | PRN

## 2014-10-31 MED ORDER — ONDANSETRON HCL 4 MG/2ML IJ SOLN
INTRAMUSCULAR | Status: DC | PRN
  Administered 2014-10-31: 4 mg via INTRAVENOUS

## 2014-10-31 MED ORDER — ACETAMINOPHEN 10 MG/ML IV SOLN
INTRAVENOUS | Status: DC | PRN
Start: 2014-10-31 — End: 2014-10-31
  Administered 2014-10-31: 1000 mg via INTRAVENOUS

## 2014-10-31 MED ORDER — SUGAMMADEX SODIUM 200 MG/2ML IV SOLN
INTRAVENOUS | Status: DC | PRN
  Administered 2014-10-31: 169.6 mg via INTRAVENOUS

## 2014-10-31 MED ORDER — PROPOFOL 10 MG/ML IV BOLUS
INTRAVENOUS | Status: DC | PRN
  Administered 2014-10-31: 150 mg via INTRAVENOUS

## 2014-10-31 MED ORDER — MIDAZOLAM HCL 2 MG/2ML IJ SOLN
INTRAMUSCULAR | Status: DC | PRN
  Administered 2014-10-31: 2 mg via INTRAVENOUS

## 2014-10-31 SURGICAL SUPPLY — 43 items
0 MONOCRYL Y436 ×3 IMPLANT
BLADE SURG SZ11 CARB STEEL (BLADE) ×3 IMPLANT
CANNULA DILATOR 12 W/SLV (CANNULA) ×2 IMPLANT
CANNULA DILATOR 12MM W/SLV (CANNULA) ×1
CATH TRAY 16F METER LATEX (MISCELLANEOUS) ×3 IMPLANT
CHLORAPREP W/TINT 26ML (MISCELLANEOUS) ×3 IMPLANT
DEVICE SECURE STRAP 25 ABSORB (INSTRUMENTS) ×3 IMPLANT
DISSECT BALLN SPACEMKR OVL PDB (BALLOONS) ×3
DISSECT BALLN SPACEMKR RND PDB (MISCELLANEOUS)
DISSECTOR BALLN SPCMKR OVL PDB (BALLOONS) ×1 IMPLANT
DISSECTOR BALLN SPCMKR RND PDB (MISCELLANEOUS) IMPLANT
GAUZE SPONGE NON-WVN 2X2 STRL (MISCELLANEOUS) IMPLANT
GLOVE BIOGEL M STRL SZ7.5 (GLOVE) ×9 IMPLANT
GLOVE BIOGEL PI IND STRL 8 (GLOVE) ×3 IMPLANT
GLOVE BIOGEL PI INDICATOR 8 (GLOVE) ×6
GOWN STRL REUS W/ TWL LRG LVL3 (GOWN DISPOSABLE) ×3 IMPLANT
GOWN STRL REUS W/TWL LRG LVL3 (GOWN DISPOSABLE) ×6
IRRIGATION STRYKERFLOW (MISCELLANEOUS) IMPLANT
IRRIGATOR STRYKERFLOW (MISCELLANEOUS)
LABEL OR SOLS (LABEL) IMPLANT
LIQUID BAND (GAUZE/BANDAGES/DRESSINGS) ×3 IMPLANT
MESH PARIETEX 6X4IN LEFT (Mesh General) ×3 IMPLANT
MESH PARIETEX 6X4IN RIGHT (Mesh General) ×3 IMPLANT
NDL SAFETY 22GX1.5 (NEEDLE) ×3 IMPLANT
NS IRRIG 500ML POUR BTL (IV SOLUTION) ×3 IMPLANT
PACK LAP CHOLECYSTECTOMY (MISCELLANEOUS) ×3 IMPLANT
PENCIL ELECTRO HAND CTR (MISCELLANEOUS) ×3 IMPLANT
SCISSORS METZENBAUM CVD 33 (INSTRUMENTS) ×3 IMPLANT
SEAL FOR SCOPE WARMER C3101 (MISCELLANEOUS) IMPLANT
SPONGE LAP 18X18 5 PK (GAUZE/BANDAGES/DRESSINGS) IMPLANT
SPONGE VERSALON 2X2 STRL (MISCELLANEOUS)
SURGILUBE 2OZ TUBE FLIPTOP (MISCELLANEOUS) ×3 IMPLANT
SUT ETHIBOND 0 (SUTURE) IMPLANT
SUT MNCRL 4-0 (SUTURE) ×2
SUT MNCRL 4-0 27XMFL (SUTURE) ×1
SUT VICRYL 0 AB UR-6 (SUTURE) ×3 IMPLANT
SUTURE MNCRL 4-0 27XMF (SUTURE) ×1 IMPLANT
SUTURE MNCRYL 0 (SUTURE) ×3 IMPLANT
TROCAR 5MM SINGLE VERSAONE (TROCAR) ×6 IMPLANT
TROCAR BALLN 10M OMST10SB SPAC (TROCAR) ×3 IMPLANT
TROCAR Z-THREAD FIOS 11X100 BL (TROCAR) IMPLANT
TUBING INSUFFLATOR HI FLOW (MISCELLANEOUS) ×3 IMPLANT
WATER STERILE IRR 1000ML POUR (IV SOLUTION) IMPLANT

## 2014-10-31 NOTE — Anesthesia Preprocedure Evaluation (Signed)
Anesthesia Evaluation  Patient identified by MRN, date of birth, ID band Patient awake    Reviewed: Allergy & Precautions, NPO status , Patient's Chart, lab work & pertinent test results, reviewed documented beta blocker date and time   Airway Mallampati: II  TM Distance: >3 FB     Dental  (+) Chipped   Pulmonary Current Smoker,          Cardiovascular     Neuro/Psych    GI/Hepatic   Endo/Other    Renal/GU      Musculoskeletal   Abdominal   Peds  Hematology   Anesthesia Other Findings   Reproductive/Obstetrics                             Anesthesia Physical Anesthesia Plan  ASA: II  Anesthesia Plan: General   Post-op Pain Management:    Induction: Intravenous  Airway Management Planned: Oral ETT  Additional Equipment:   Intra-op Plan:   Post-operative Plan:   Informed Consent: I have reviewed the patients History and Physical, chart, labs and discussed the procedure including the risks, benefits and alternatives for the proposed anesthesia with the patient or authorized representative who has indicated his/her understanding and acceptance.     Plan Discussed with: CRNA  Anesthesia Plan Comments:         Anesthesia Quick Evaluation  

## 2014-10-31 NOTE — H&P (View-Only) (Signed)
Surgical Consultation  10/16/2014  Eric Snyder is an 49 y.o. male.   Chief Complaint  Patient presents with  . Other    Consult-Right Inguinal Hernia     HPI: 49-year-old male presents to clinic for evaluation of groin pain. He states that he's been having left groin pain for approximately 18 months that has been progressively getting worse. Torsed for certain positions and after having a stressful or active active day. Tries the pain as a dull pressure that is constant. It does not probably does not stab. Sometimes it shoots down into the scrotum and on occasion he feels it on the opposite side. He states that he has on occasion noticed a bulge in his left groin that he has had to reduce. He has never noticed a bulge on his right side. The bulge has never been stuck out, it has always reduced on its own when he lays flat. He denies any fevers, chills, sweats, shortness of breath, chest pain, nausea, vomiting, constipation, diarrhea. Outside of this groin pain he states his only other problem is his back pain for which she sees a back specialist for.  Past Medical History  Diagnosis Date  . Right inguinal hernia     History reviewed. No pertinent past surgical history.  History reviewed. No pertinent family history.  Social History:  reports that he has been smoking Cigarettes.  He has a 30 pack-year smoking history. He has never used smokeless tobacco. He reports that he does not drink alcohol or use illicit drugs.  Allergies: No Known Allergies  Medications reviewed.     ROS multisystem review of systems was completed. All pertinent positives and negatives were within the history of present illness the remainder were negative.     BP 125/81 mmHg  Pulse 64  Temp(Src) 98.1 F (36.7 C) (Oral)  Ht 5' 10" (1.778 m)  Wt 84.823 kg (187 lb)  BMI 26.83 kg/m2  Physical Exam  Constitutional: He is oriented to person, place, and time and well-developed, well-nourished, and in  no distress.  HENT:  Head: Normocephalic and atraumatic.  Eyes: Conjunctivae are normal. Pupils are equal, round, and reactive to light.  Neck: Normal range of motion.  Cardiovascular: Normal rate, regular rhythm and normal heart sounds.   Pulmonary/Chest: Effort normal and breath sounds normal.  Abdominal: Soft. Bowel sounds are normal. He exhibits no distension. There is tenderness. A hernia is present. Hernia confirmed positive in the right inguinal area and confirmed positive in the left inguinal area.  Genitourinary: Penis normal.  Musculoskeletal: Normal range of motion.  Neurological: He is alert and oriented to person, place, and time. Gait normal.  Skin: Skin is warm and dry.  Psychiatric: Affect and judgment normal.      No results found for this or any previous visit (from the past 48 hour(s)). No results found.  Assessment/Plan: 1. Bilateral inguinal hernia with obstruction and without gangrene, recurrence not specified 49-year-old male with bilateral internal hernias. Both of which are soft and easily reducible. Patient was offered open versus laparoscopic bilateral inguinal hernia repairs. The benefits risks and alternatives for both were discussed in detail and the patient voiced understanding. Patient looks undergo a laparoscopic bilateral hernia repair, scheduled for Thursday, September 8.  I have explained the procedure, risks, and aftercare of inguinal hernia repair to Eric Snyder.   Risks include but are not limited to bleeding, infection, wound problems, anesthesia, recurrence, bladder or intestine injury, urinary retention, testicular dysfunction, chronic pain, mesh problems.    He  seems to understand and agrees to proceed.  Questions were answered to his stated satisfaction.    Aashir Umholtz Aarian Griffie, MD FACS General Surgeon [facility] 10/16/2014   

## 2014-10-31 NOTE — Interval H&P Note (Signed)
History and Physical Interval Note:  10/31/2014 8:18 AM  Eric Snyder  has presented today for surgery, with the diagnosis of BILATERAL INGUINAL HERNIA  The various methods of treatment have been discussed with the patient and family. After consideration of risks, benefits and other options for treatment, the patient has consented to  Procedure(s): LAPAROSCOPIC INGUINAL HERNIA (Bilateral) as a surgical intervention .  The patient's history has been reviewed, patient examined, no change in status, stable for surgery.  I have reviewed the patient's chart and labs.  Questions were answered to the patient's satisfaction.     Clayburn Pert

## 2014-10-31 NOTE — Telephone Encounter (Signed)
Left voice message for patient to call and schedule with Dr. Adonis Huguenin for a 2 week post op bi lateral hernia repair

## 2014-10-31 NOTE — Brief Op Note (Signed)
10/31/2014  10:42 AM  PATIENT:  Eric Snyder  49 y.o. male  PRE-OPERATIVE DIAGNOSIS:  BILATERAL INGUINAL HERNIA  POST-OPERATIVE DIAGNOSIS:  BILATERAL INGUINAL HERNIAS  PROCEDURE:  Procedure(s): LAPAROSCOPIC INGUINAL HERNIA (Bilateral)  SURGEON:  Surgeon(s) and Role:    * Clayburn Pert, MD - Primary  PHYSICIAN ASSISTANT:   ASSISTANTS: none   ANESTHESIA:   general  EBL:  Total I/O In: 800 [I.V.:800] Out: 150 [Urine:150]  BLOOD ADMINISTERED:none  DRAINS: none   LOCAL MEDICATIONS USED: 0.5% MARCAINE   , 1% LIDOCAINE  and Amount: 20 ml  SPECIMEN:  No Specimen  DISPOSITION OF SPECIMEN:  N/A  COUNTS:  YES  TOURNIQUET:  * No tourniquets in log *  DICTATION: .Dragon Dictation  PLAN OF CARE: Discharge to home after PACU  PATIENT DISPOSITION:  PACU - hemodynamically stable.   Delay start of Pharmacological VTE agent (>24hrs) due to surgical blood loss or risk of bleeding: not applicable

## 2014-10-31 NOTE — Transfer of Care (Signed)
Immediate Anesthesia Transfer of Care Note  Patient: Eric Snyder  Procedure(s) Performed: Procedure(s): LAPAROSCOPIC INGUINAL HERNIA (Bilateral)  Patient Location: PACU  Anesthesia Type:General  Level of Consciousness: awake, alert  and oriented  Airway & Oxygen Therapy: Patient Spontanous Breathing and Patient connected to nasal cannula oxygen  Post-op Assessment: Report given to RN, VSS  Post vital signs: Reviewed and stable  Last Vitals:  Filed Vitals:   10/31/14 1044  BP: 128/81  Pulse: 76  Temp: 36.3 C  Resp: 13    Complications: No apparent anesthesia complications

## 2014-10-31 NOTE — Anesthesia Postprocedure Evaluation (Signed)
  Anesthesia Post-op Note  Patient: Eric Snyder  Procedure(s) Performed: Procedure(s): LAPAROSCOPIC INGUINAL HERNIA with mesh  (Bilateral)  Anesthesia type:General  Patient location: PACU  Post pain: Pain level controlled  Post assessment: Post-op Vital signs reviewed, Patient's Cardiovascular Status Stable, Respiratory Function Stable, Patent Airway and No signs of Nausea or vomiting  Post vital signs: Reviewed and stable  Last Vitals:  Filed Vitals:   10/31/14 1249  BP: 132/85  Pulse: 75  Temp:   Resp: 16    Level of consciousness: awake, alert  and patient cooperative  Complications: No apparent anesthesia complications

## 2014-10-31 NOTE — Discharge Instructions (Signed)
Inguinal Hernia, Adult  °Care After  °Refer to this sheet in the next few weeks. These discharge instructions provide you with general information on caring for yourself after you leave the hospital. Your caregiver may also give you specific instructions. Your treatment has been planned according to the most current medical practices available, but unavoidable complications sometimes occur. If you have any problems or questions after discharge, please call your caregiver.  °HOME CARE INSTRUCTIONS  °Change bandages (dressings) as directed.  °Keep the wound dry and clean. The wound may be washed gently with soap and water. Gently blot or dab the wound dry. It is okay to take showers 24 to 48 hours after surgery. Do not take baths, use swimming pools, or use hot tubs for 10 days, or as directed by your caregiver.  °Only take over-the-counter or prescription medicines for pain, discomfort, or fever as directed by your caregiver.  °Continue your normal diet as directed.  °Do not lift anything more than 10 pounds or play contact sports for 3 weeks, or as directed. °SEEK MEDICAL CARE IF:  °There is redness, swelling, or increasing pain in the wound.  °There is fluid (pus) coming from the wound.  °There is drainage from a wound lasting longer than 1 day.  °You have an oral temperature above 102° F (38.9° C).  °You notice a bad smell coming from the wound or dressing.  °The wound breaks open after the stitches (sutures) have been removed.  °You notice increasing pain in the shoulders (shoulder strap areas).  °You develop dizzy episodes or fainting while standing.  °You feel sick to your stomach (nauseous) or throw up (vomit). °SEEK IMMEDIATE MEDICAL CARE IF:  °You have difficulty breathing.  °You develop a reaction or have side effects to medicines you were given. °MAKE SURE YOU:  °Understand these instructions.  °Will watch your condition.  °Will get help right away if you are not doing well or get worse. ° °

## 2014-10-31 NOTE — OR Nursing (Signed)
Patient remains sleepy and sats dropping, Dr. Kayleen Memos notified and patient given a Duoneb treatment.

## 2014-10-31 NOTE — Op Note (Addendum)
LaparoscopicBilateral Inguinal Hernia Repair  Eric Snyder  10/31/2014  Pre-operative Diagnosis: Bilateral Inguinal Hernia  Post-operative Diagnosis: Bilateral  Inguinal hernia  Procedure: Laparoscopic preperitoneal repair of bilateral inguinal hernias   Surgeon: Juanda Crumble T. Adonis Huguenin, MD FACS  Anesthesia: Gen. with endotracheal tube  Assistant:None  Procedure Details  The patient was seen again in the Holding Room. The benefits, complications, treatment options, and expected outcomes were discussed with the patient. The risks of bleeding, infection, recurrence of symptoms, failure to resolve symptoms, recurrence of hernia, ischemic orchitis, chronic pain syndrome or neuroma, were discussed again. The likelihood of improving the patient's symptoms with return to their baseline status is good.  The patient and/or family concurred with the proposed plan, giving informed consent.  The patient was taken to Operating Room, identified as Eric Snyder and the procedure verified as Laparoscopic Inguinal Hernia Repair. Bilaterality confirmed.  A Time Out was held and the above information confirmed.  Prior to the induction of general anesthesia, antibiotic prophylaxis was administered. VTE prophylaxis was in place. General endotracheal anesthesia was then administered and tolerated well. A Foley catheter was placed by me. Perineal care was performed prior to insertion, the catheter was hubbed and returned yellow urine prior to inflation of the balloon, there was no blood present in the catheter or at the meatus at any point during the operation. After the induction, the abdomen was prepped with Chloraprep and draped in the sterile fashion. The patient was positioned in the supine position.  Local anesthetic  was injected into the skin near the umbilicus and an incision made. An incision was made and dissection down to the rectus fascia was performed. The fascia was incised and the muscle retracted  laterally. The Covidien dissecting balloon was placed followed by the structural balloon. The preperitoneal space was insufflated and under direct vision 2 midline 5 mm ports were placed.  Dissection was performed to delineate Cooper's ligament and the lateral extent of dissection was determined on each side. The nerve on the lateral abdominal wall was identified and kept in view at all times. The cord was skeletonized and cord lipoma which was retracted cephalad on each side. Patient was noted to have direct inguinal hernias bilaterally. The left was much larger than the right. The contents of the bilateral hernia spaces were retracted cephalad on each side equal to the cord lipomas.  Once this was complete, an anatomical polyester Covidian mesh was placed into the preperitoneal space on each side. They were held in place with the secure strap tacking device avoiding the area of the nerve. Once assuring that the hernias were completely repaired and adequately covered, the preperitoneal space was desufflated under direct vision. There was no sign of peritoneal rent and no sign of bowel intrusion towards the mesh.  Once assuring that hemostasis was adequate the ports were removed and a figure-of-eight 0 Vicryl suture was placed at the fascial edges. 4-0 subcuticular Monocryl was used at all skin edges. Dermabond was then placed.  Patient tolerated the procedure well. There were no immediate complications. The foley catheter was removed prior to awaking from general anesthesia without complication.   He was taken to the recovery room in stable condition to be discharged to the care of his family and follow-up in 10 days.    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                        Amour Cutrone T. Adonis Huguenin,  MD, FACS

## 2014-11-01 ENCOUNTER — Emergency Department (HOSPITAL_COMMUNITY): Payer: 59

## 2014-11-01 ENCOUNTER — Telehealth: Payer: Self-pay | Admitting: General Surgery

## 2014-11-01 ENCOUNTER — Ambulatory Visit (INDEPENDENT_AMBULATORY_CARE_PROVIDER_SITE_OTHER): Payer: 59 | Admitting: Emergency Medicine

## 2014-11-01 ENCOUNTER — Encounter (HOSPITAL_COMMUNITY): Payer: Self-pay | Admitting: Emergency Medicine

## 2014-11-01 ENCOUNTER — Encounter: Payer: Self-pay | Admitting: General Surgery

## 2014-11-01 ENCOUNTER — Emergency Department (HOSPITAL_COMMUNITY)
Admission: EM | Admit: 2014-11-01 | Discharge: 2014-11-01 | Discharge status: Against Medical Advice | Payer: 59 | Attending: Emergency Medicine | Admitting: Emergency Medicine

## 2014-11-01 VITALS — BP 120/82 | HR 70 | Temp 97.5°F | Resp 16 | Ht 70.0 in

## 2014-11-01 DIAGNOSIS — R109 Unspecified abdominal pain: Secondary | ICD-10-CM | POA: Diagnosis not present

## 2014-11-01 DIAGNOSIS — Z720 Tobacco use: Secondary | ICD-10-CM | POA: Insufficient documentation

## 2014-11-01 DIAGNOSIS — R1084 Generalized abdominal pain: Secondary | ICD-10-CM | POA: Diagnosis not present

## 2014-11-01 DIAGNOSIS — R1031 Right lower quadrant pain: Secondary | ICD-10-CM

## 2014-11-01 LAB — URINALYSIS, ROUTINE W REFLEX MICROSCOPIC
Bilirubin Urine: NEGATIVE
GLUCOSE, UA: NEGATIVE mg/dL
HGB URINE DIPSTICK: NEGATIVE
KETONES UR: NEGATIVE mg/dL
Leukocytes, UA: NEGATIVE
Nitrite: NEGATIVE
PROTEIN: NEGATIVE mg/dL
Specific Gravity, Urine: 1.021 (ref 1.005–1.030)
UROBILINOGEN UA: 0.2 mg/dL (ref 0.0–1.0)
pH: 6 (ref 5.0–8.0)

## 2014-11-01 LAB — COMPREHENSIVE METABOLIC PANEL
ALK PHOS: 60 U/L (ref 38–126)
ALT: 22 U/L (ref 17–63)
ANION GAP: 8 (ref 5–15)
AST: 28 U/L (ref 15–41)
Albumin: 4.1 g/dL (ref 3.5–5.0)
BILIRUBIN TOTAL: 0.6 mg/dL (ref 0.3–1.2)
BUN: 12 mg/dL (ref 6–20)
CALCIUM: 8.8 mg/dL — AB (ref 8.9–10.3)
CO2: 25 mmol/L (ref 22–32)
CREATININE: 0.98 mg/dL (ref 0.61–1.24)
Chloride: 107 mmol/L (ref 101–111)
GFR calc non Af Amer: >60 mL/min (ref >60)
GLUCOSE: 159 mg/dL — AB (ref 65–99)
Potassium: 3.7 mmol/L (ref 3.5–5.1)
SODIUM: 140 mmol/L (ref 135–145)
TOTAL PROTEIN: 7.2 g/dL (ref 6.5–8.1)

## 2014-11-01 LAB — POCT CBC
GRANULOCYTE PERCENT: 64.4 % (ref 37–80)
HEMATOCRIT: 48.5 % (ref 43.5–53.7)
Hemoglobin: 16 g/dL (ref 14.1–18.1)
Lymph, poc: 4.2 — AB (ref 0.6–3.4)
MCH: 29.5 pg (ref 27–31.2)
MCHC: 32.9 g/dL (ref 31.8–35.4)
MCV: 89.6 fL (ref 80–97)
MID (CBC): 0.2 (ref 0–0.9)
MPV: 7.6 fL (ref 0–99.8)
PLATELET COUNT, POC: 253 10*3/uL (ref 142–424)
POC Granulocyte: 7.9 — AB (ref 2–6.9)
POC LYMPH %: 34.1 % (ref 10–50)
POC MID %: 1.5 % (ref 0–12)
RBC: 5.41 M/uL (ref 4.69–6.13)
RDW, POC: 13.5 %
WBC: 12.3 10*3/uL — AB (ref 4.6–10.2)

## 2014-11-01 LAB — CBC
HCT: 45.4 % (ref 39.0–52.0)
Hemoglobin: 15.2 g/dL (ref 13.0–17.0)
MCH: 30.8 pg (ref 26.0–34.0)
MCHC: 33.5 g/dL (ref 30.0–36.0)
MCV: 91.9 fL (ref 78.0–100.0)
PLATELETS: 205 10*3/uL (ref 150–400)
RBC: 4.94 MIL/uL (ref 4.22–5.81)
RDW: 13 % (ref 11.5–15.5)
WBC: 11 10*3/uL — ABNORMAL HIGH (ref 4.0–10.5)

## 2014-11-01 LAB — LIPASE, BLOOD: Lipase: 16 U/L — ABNORMAL LOW (ref 22–51)

## 2014-11-01 MED ORDER — IOHEXOL 300 MG/ML  SOLN: 100.0000 mL | Freq: Once | INTRAMUSCULAR | Status: DC | PRN

## 2014-11-01 MED ORDER — ONDANSETRON HCL 4 MG/2ML IJ SOLN
4.0000 mg | Freq: Once | INTRAMUSCULAR | Status: AC
  Administered 2014-11-01: 4 mg via INTRAVENOUS
  Filled 2014-11-01: qty 2

## 2014-11-01 MED ORDER — IOHEXOL 300 MG/ML  SOLN
25.0000 mL | Freq: Once | INTRAMUSCULAR | Status: AC | PRN
  Administered 2014-11-01: 25 mL via ORAL

## 2014-11-01 MED ORDER — IOHEXOL 300 MG/ML  SOLN
100.0000 mL | Freq: Once | INTRAMUSCULAR | Status: AC | PRN
  Administered 2014-11-01: 100 mL via INTRAVENOUS

## 2014-11-01 MED ORDER — FENTANYL CITRATE (PF) 100 MCG/2ML IJ SOLN
100.0000 ug | Freq: Once | INTRAMUSCULAR | Status: AC
  Administered 2014-11-01: 100 ug via INTRAVENOUS
  Filled 2014-11-01: qty 2

## 2014-11-01 NOTE — ED Notes (Signed)
Pt and family agitated, refusing care and upset about waiting.  Delay explained by multiple staff members, CT called to check on scan (informed pt ready to be scanned), pt refused to stay, shouting at staff and left.

## 2014-11-01 NOTE — ED Notes (Signed)
Delay in lab draw, ptr in bathroom

## 2014-11-01 NOTE — ED Notes (Addendum)
Pt and wife out to bus stop to smoke.  Wife states they are back in waiting room now in case they are called.  Updated on wait.

## 2014-11-01 NOTE — ED Notes (Signed)
Pt refused to sign AMA - left dep - was ambulatory and in NAD

## 2014-11-01 NOTE — ED Notes (Signed)
Called for pt to be put back in room 2x, No response either time.

## 2014-11-01 NOTE — Telephone Encounter (Signed)
Please call patients wife, Clearnce Sorrel at [phone] - patient had laparoscopic inguinal hernia with mesh yesterday with Dr Adonis Huguenin. Very painful to urinate, hasn't been able to urinate because it hurts so bad. Having right side pain - not near his incision - states that pain  is sharp like a knif.

## 2014-11-01 NOTE — ED Notes (Signed)
Wife wants to know how they can get CT results.  Explained to wife they could use My Chart, go through medical records, or follow-up with PCP that could get them.  Encouraged them to stay for pt to be evaluated.

## 2014-11-01 NOTE — Telephone Encounter (Signed)
Patients wife Alyson left a voice message that Octavia Bruckner was in pain when he pees and pain on his right side. She called at 8:42 p.m. She said they might go to the local ED. Please call.  [phone]

## 2014-11-01 NOTE — Progress Notes (Signed)
Patient ID: Eric Snyder, male   DOB: 08-Jul-1965, 49 y.o.   MRN: [id number]      Chief Complaint:  Chief Complaint  Patient presents with  . Post-op Problem    He has a lump on his lower abdominal area, he has surgey yesterday  . requesting x-ray    HPI: Eric Snyder is a 49 y.o. male who reports to [facility] today complaining of severe pain in the right side of his abdomen. Apparently last night he had difficulty with urination. It was suggested he go to the emergency room to consider catheter placement. He eventually was able to pass his urine. Today he has been experiencing significant right lower lateral abdominal pain. He has been able to void.. Surgery was performed yesterday at [facility]. He had bilateral inguinal hernia repairs with removal of lipomas in the cords. He denies fever. He denies vomiting. He did take a pain medication proximally 30 minutes ago. His surgeon was Dr. Adonis Huguenin at [facility]. He had surgery performed there because of insurance issues.  Past Medical History  Diagnosis Date  . Right inguinal hernia   . Medical history non-contributory    Past Surgical History  Procedure Laterality Date  . No past surgeries    . Inguinal hernia repair Bilateral 10/31/2014    Procedure: LAPAROSCOPIC INGUINAL HERNIA with mesh ;  Surgeon: Clayburn Pert, MD;  Location: [facility] ORS;  Service: General;  Laterality: Bilateral;   Social History   Social History  . Marital Status: Married    Spouse Name: N/A  . Number of Children: N/A  . Years of Education: N/A   Social History Main Topics  . Smoking status: Current Every Day Smoker -- 1.00 packs/day for 30 years    Types: Cigarettes  . Smokeless tobacco: Never Used  . Alcohol Use: No  . Drug Use: No  . Sexual Activity: Yes   Other Topics Concern  . None   Social History Narrative   Family History  Problem Relation Age of Onset  . CAD Mother   . CAD Maternal Grandfather    No Known Allergies Prior to  Admission medications   Medication Sig Start Date End Date Taking? Authorizing Provider  Cholecalciferol (VITAMIN D-3 PO) Take 1 tablet by mouth daily.   Yes Historical Provider, MD  cyclobenzaprine (FLEXERIL) 10 MG tablet Take 10 mg by mouth at bedtime as needed for muscle spasms.   Yes Historical Provider, MD  Diclofenac Sodium (VOLTAREN PO) Take by mouth.   Yes Historical Provider, MD  ondansetron (ZOFRAN ODT) 4 MG disintegrating tablet Take 1 tablet (4 mg total) by mouth every 8 (eight) hours as needed for nausea or vomiting. 10/31/14  Yes Clayburn Pert, MD  oxyCODONE-acetaminophen (PERCOCET) 7.5-325 MG per tablet Take 1-2 tablets by mouth every 4 (four) hours as needed for severe pain. 10/31/14  Yes Clayburn Pert, MD  Tapentadol HCl (NUCYNTA) 100 MG TABS Take 100 mg by mouth 2 (two) times daily as needed (pain).   Yes Historical Provider, MD  diclofenac sodium (VOLTAREN) 1 % GEL Apply 2 g topically 4 (four) times daily.    Historical Provider, MD  docusate sodium (COLACE) 100 MG capsule Take 1 capsule (100 mg total) by mouth 2 (two) times daily. Patient not taking: Reported on 11/01/2014 10/31/14   Clayburn Pert, MD     ROS: The patient denies fevers, chills, night sweats, unintentional weight loss, chest pain, palpitations, wheezing, dyspnea on exertion, nausea, vomiting, abdominal pain, dysuria, hematuria, melena, numbness, weakness,  or tingling.   All other systems have been reviewed and were otherwise negative with the exception of those mentioned in the HPI and as above.    PHYSICAL EXAM: Filed Vitals:   11/01/14 1323  BP: 120/82  Pulse: 70  Temp: 97.5 F (36.4 C)  Resp: 16   There is no weight on file to calculate BMI.   General: Alert, no acute distress HEENT:  Normocephalic, atraumatic, oropharynx patent. Eye: Juliette Mangle [facility] Cardiovascular:  Regular rate and rhythm, no rubs murmurs or gallops.  No Carotid bruits, radial pulse intact. No pedal edema.  Respiratory: Clear to  auscultation bilaterally.  No wheezes, rales, or rhonchi.  No cyanosis, no use of accessory musculature Abdominal: Bowel sounds are hyperactive. There are incisions just below the umbilicus and in both lower quadrants. There is exquisite tenderness in the right lower abdominal area as well as suprapubic area. There is some bruising present into the upper portions of the genitals.. Musculoskeletal: Gait intact. No edema, tenderness Skin: No rashes. Neurologic: Facial musculature symmetric. Psychiatric: Patient acts appropriately throughout our interaction. Lymphatic: No cervical or submandibular lymphadenopathy Genitourinary/Anorectal: No acute findings   LABS: Results for orders placed or performed in visit on 10/19/13  Hemochromatosis DNA-PCR(c282y,h63d)  Result Value Ref Range   DNA Mutation Analysis REPORT    Results for orders placed or performed in visit on 11/01/14  POCT CBC  Result Value Ref Range   WBC 12.3 (A) 4.6 - 10.2 K/uL   Lymph, poc 4.2 (A) 0.6 - 3.4   POC LYMPH PERCENT 34.1 10 - 50 %L   MID (cbc) 0.2 0 - 0.9   POC MID % 1.5 0 - 12 %M   POC Granulocyte 7.9 (A) 2 - 6.9   Granulocyte percent 64.4 37 - 80 %G   RBC 5.41 4.69 - 6.13 M/uL   Hemoglobin 16.0 14.1 - 18.1 g/dL   HCT, POC 48.5 43.5 - 53.7 %   MCV 89.6 80 - 97 fL   MCH, POC 29.5 27 - 31.2 pg   MCHC 32.9 31.8 - 35.4 g/dL   RDW, POC 13.5 %   Platelet Count, POC 253 142 - 424 K/uL   MPV 7.6 0 - 99.8 fL    EKG/XRAY:   Primary read interpreted by Dr. Everlene Farrier at [facility].   ASSESSMENT/PLAN: Patient presents with severe right-sided abdominal discomfort following surgery performed yesterday. Initially had issues with urinary retention which seems some better. Patient will have an IV started CBC done and will be transported to [facility] for their evaluation.Johney Maine sideeffects, risk and benefits, and alternatives of medications d/w patient. Patient is aware that all medications have potential sideeffects and we  are unable to predict every sideeffect or drug-drug interaction that may occur.  Arlyss Queen MD 11/01/2014 2:02 PM

## 2014-11-01 NOTE — Telephone Encounter (Signed)
Spoke with patient's wife at this time. She states that all throughout last night and today, patient is having difficulty with urination. States that he has urinated a total of approximately 1 cup full since last night. She spoke with Dr. Burt Knack last night who was explaining that patient will most likely need to have Foley catheter placed by the weekend. Pt was refusing to have this done last night. States that he is bruised on the right side away from all of incisions. Tried to explain how the surgery was done and that he had both sides repaired and that bruising to both sides may be experienced. This patient's wife is very difficult to agree to anything. As I am trying to get to the bottom of this problem, she is continuously interrupting me to tell me about the bad experience they had in PACU yesterday. I explained that I was not at hospital yesterday and I am sorry that they had a bad experience but we need to deal with current problem at hand.  Spoke with Dr. Heath Lark regarding this problem. She is agreeable to see patient in [place] office today. This was offered to the patient's wife or the other option is to go to Emergency Room.   She would like to discuss this with patient and return phone call to office to let us know what they will be doing.

## 2014-11-01 NOTE — ED Notes (Signed)
Wife states they are back.

## 2014-11-01 NOTE — ED Notes (Signed)
Wife states they are going to car to sit and will be back.

## 2014-11-01 NOTE — Progress Notes (Deleted)
   Subjective:    Patient ID: Eric Snyder, male    DOB: [birth year], 49 y.o.   MRN: [id number]  HPI    Review of Systems     Objective:   Physical Exam        Assessment & Plan:

## 2014-11-01 NOTE — ED Notes (Signed)
Wife states they are going to go home and wants the CT results sent to MD.  Explained triage process and current wait for treatment room.  Encouraged them to wait to be evaluated.  She states pt's pain is better now because she has been giving him his pain medication while waiting.  Also states he can urinate now and that is no longer a problem.  Encouraged them to still wait to be seen and that 5 people are in front of him now and wait she not be much longer.

## 2014-11-01 NOTE — ED Notes (Signed)
Per EMS pt having RLQ pain with a lump and swelling to his abdomin and difficulty urinating. Had two hernia repairs yesterday, as well as a procedure to remove the fluid from his testicles. Went to his doctors office today and was told to come here.

## 2014-11-01 NOTE — ED Notes (Signed)
Pt from [facility] ED for CT scan, states he had hernia repair surgery on 10/31/14. Pt reports right sided abd pain and tenderness that started right after surgery. Pt denies any n/v/d and was told by PCP Dr. Josepha Pigg to have CT scan done. Pt wife states pt already drank oral contrast but left before CT was done. Pt noted to have 22G IV in arm. nad noted.

## 2014-11-01 NOTE — Telephone Encounter (Signed)
Called patient back at this time. No answer. Left voicemail stating that Surgeon is leaving to go back to hospital since we have not heard from them. If patient is still having difficulty with urination, he needs to go to Emergency Room.

## 2014-11-02 ENCOUNTER — Telehealth: Payer: Self-pay

## 2014-11-02 ENCOUNTER — Emergency Department (HOSPITAL_COMMUNITY)
Admission: EM | Admit: 2014-11-02 | Discharge: 2014-11-02 | Discharge status: Against Medical Advice | Payer: 59 | Source: Home / Self Care

## 2014-11-02 ENCOUNTER — Encounter: Payer: Self-pay | Admitting: Internal Medicine

## 2014-11-02 NOTE — Telephone Encounter (Signed)
Called thru answering service [phone] 9/10 Wife extremely upset. Patient in severe pain has just finally responded to 3 Percocet 7.5 and fall asleep. See visit to you [place] yesterday with referral to the emergency room. They had an extended wait with lots of difficulty starting an IV and lots of delay in reporting about procedures that were done and eventually they left after midnight. They still had not talked with anyone about the results of the scan. Emergency room notes suggested a left AMA although she tells a different story that they left after out of frustration after nothing was accomplished. They have paged surgeon who didn't answer and he didn't come to the emergency room according to the wife. They have page the surgical practice  today without an answer for the last 2 hours according to the wife.  He has right lateral abdominal swelling away from the incision site for his bilateral hernia surgery performed by laparoscopy. This swelling is causing the pain that doesn't go away with pain medication. There is no fever, vomiting, diarrhea, or dysuria.  His general history includes: Patient Active Problem List   Diagnosis Date Noted  . Bilateral inguinal hernia 10/16/2014  . Left inguinal hernia 12/29/2012   there are no other active medical problems Current Outpatient Prescriptions on File Prior to Visit  Medication Sig Dispense Refill  . Cholecalciferol (VITAMIN D-3 PO) Take 1 tablet by mouth daily.    . cyclobenzaprine (FLEXERIL) 10 MG tablet Take 10 mg by mouth at bedtime as needed for muscle spasms.    . diclofenac (VOLTAREN) 25 MG EC tablet Take 25 mg by mouth 2 (two) times daily.    . diclofenac sodium (VOLTAREN) 1 % GEL Apply 2 g topically 4 (four) times daily.    Marland Kitchen docusate sodium (COLACE) 100 MG capsule Take 1 capsule (100 mg total) by mouth 2 (two) times daily. (Patient not taking: Reported on 11/01/2014) 10 capsule 0  . ondansetron (ZOFRAN ODT) 4 MG disintegrating tablet Take 1 tablet  (4 mg total) by mouth every 8 (eight) hours as needed for nausea or vomiting. 20 tablet 0  . oxyCODONE-acetaminophen (PERCOCET) 7.5-325 MG per tablet Take 1-2 tablets by mouth every 4 (four) hours as needed for severe pain. 30 tablet 0   Urine and blood work in the emergency room appears normal   I have suggested observation for fever or signs of infection eyes for any swelling in the continued use of pain medicine even if he has take 15 mg of oxycodone instead of 7.5. They are advised to continue to page surgery practice for an opinion about whether this is an emergency based on the CT scan results and his current symptoms. We are available to see him again tomorrow if necessary.

## 2014-11-02 NOTE — ED Provider Notes (Signed)
CSN: [id number]     Arrival date & time 11/01/14  1438 History   First MD Initiated Contact with Patient 11/01/14 1700     Chief Complaint  Patient presents with  . Post-op Problem  . Abdominal Pain  . Urinary Retention     (Consider location/radiation/quality/duration/timing/severity/associated sxs/prior Treatment) HPI  Expand All Collapse All   Per EMS pt having RLQ pain with a lump and swelling to his abdomin and difficulty urinating. Had two hernia repairs yesterday, as well as a procedure to remove the fluid from his testicles. Went to his doctors office today and was told to come here        Past Medical History  Diagnosis Date  . Right inguinal hernia   . Medical history non-contributory    Past Surgical History  Procedure Laterality Date  . No past surgeries    . Inguinal hernia repair Bilateral 10/31/2014    Procedure: LAPAROSCOPIC INGUINAL HERNIA with mesh ;  Surgeon: Clayburn Pert, MD;  Location: [facility] ORS;  Service: General;  Laterality: Bilateral;   Family History  Problem Relation Age of Onset  . CAD Mother   . CAD Maternal Grandfather    Social History  Substance Use Topics  . Smoking status: Current Every Day Smoker -- 1.00 packs/day for 30 years    Types: Cigarettes  . Smokeless tobacco: Never Used  . Alcohol Use: No    Review of Systems  Constitutional: Negative for fever and chills.  Gastrointestinal: Positive for abdominal pain. Negative for blood in stool and abdominal distention.  Genitourinary: Negative for hematuria and discharge.      Allergies  Review of patient's allergies indicates no known allergies.  Home Medications   Prior to Admission medications   Medication Sig Start Date End Date Taking? Authorizing Provider  Cholecalciferol (VITAMIN D-3 PO) Take 1 tablet by mouth daily.   Yes Historical Provider, MD  diclofenac (VOLTAREN) 25 MG EC tablet Take 25 mg by mouth 2 (two) times daily.   Yes Historical Provider, MD  diclofenac  sodium (VOLTAREN) 1 % GEL Apply 2 g topically 4 (four) times daily.   Yes Historical Provider, MD  ondansetron (ZOFRAN ODT) 4 MG disintegrating tablet Take 1 tablet (4 mg total) by mouth every 8 (eight) hours as needed for nausea or vomiting. 10/31/14  Yes Clayburn Pert, MD  oxyCODONE-acetaminophen (PERCOCET) 7.5-325 MG per tablet Take 1-2 tablets by mouth every 4 (four) hours as needed for severe pain. 10/31/14  Yes Clayburn Pert, MD  cyclobenzaprine (FLEXERIL) 10 MG tablet Take 10 mg by mouth at bedtime as needed for muscle spasms.    Historical Provider, MD  docusate sodium (COLACE) 100 MG capsule Take 1 capsule (100 mg total) by mouth 2 (two) times daily. Patient not taking: Reported on 11/01/2014 10/31/14   Clayburn Pert, MD   BP 155/99 mmHg  Pulse 73  Temp(Src) 98.3 F (36.8 C) (Oral)  Resp 18  SpO2 95% Physical Exam  Constitutional: He is oriented to person, place, and time. He appears well-developed and well-nourished. No distress.  HENT:  Head: Normocephalic and atraumatic.  Eyes: Pupils are equal, round, and reactive to light.  Neck: Normal range of motion.  Cardiovascular: Normal rate and intact distal pulses.   Pulmonary/Chest: No respiratory distress.  Abdominal: Normal appearance. He exhibits no distension. There is tenderness in the right lower quadrant. There is no rigidity, no rebound, no guarding, no CVA tenderness and negative Murphy's sign. No hernia.  Musculoskeletal: Normal range of motion.  Neurological: He is alert and oriented to person, place, and time. No cranial nerve deficit.  Skin: Skin is warm and dry. No rash noted.  Psychiatric: He has a normal mood and affect. His behavior is normal.  Nursing note and vitals reviewed.   ED Course  Procedures (including critical care time) Medications  fentaNYL (SUBLIMAZE) injection 100 mcg (100 mcg Intravenous Given 11/01/14 1837)  ondansetron (ZOFRAN) injection 4 mg (4 mg Intravenous Given 11/01/14 1837)  iohexol  (OMNIPAQUE) 300 MG/ML solution 25 mL (25 mLs Oral Contrast Given 11/01/14 1835)    Labs Review Labs Reviewed  LIPASE, BLOOD - Abnormal; Notable for the following:    Lipase 16 (*)    All other components within normal limits  COMPREHENSIVE METABOLIC PANEL - Abnormal; Notable for the following:    Glucose, Bld 159 (*)    Calcium 8.8 (*)    All other components within normal limits  CBC - Abnormal; Notable for the following:    WBC 11.0 (*)    All other components within normal limits  URINALYSIS, ROUTINE W REFLEX MICROSCOPIC (NOT AT [facility]) - Abnormal; Notable for the following:    APPearance CLOUDY (*)    All other components within normal limits     Patient became upset before  CT scan. Left department AMA despite attempts to convince them to stay.  Refused to sign AMA paperwork  MDM   Final diagnoses:  Right lower quadrant abdominal pain        Leonard Schwartz, MD 11/02/14 1330

## 2014-11-02 NOTE — ED Notes (Signed)
Called for pt 2x times with no response.

## 2014-11-02 NOTE — Telephone Encounter (Signed)
Please have a provider contact Alyson or Tim regarding his CT Scan  See the e-mail written prior to this message.    [phone]

## 2014-11-03 NOTE — Telephone Encounter (Signed)
Patient went to the ER but left before being called back to a room

## 2014-11-04 ENCOUNTER — Telehealth: Payer: Self-pay

## 2014-11-04 ENCOUNTER — Telehealth: Payer: Self-pay | Admitting: General Surgery

## 2014-11-04 ENCOUNTER — Ambulatory Visit (INDEPENDENT_AMBULATORY_CARE_PROVIDER_SITE_OTHER): Payer: 59 | Admitting: Internal Medicine

## 2014-11-04 VITALS — BP 130/80 | HR 75 | Temp 98.2°F | Resp 20 | Ht 70.0 in | Wt 189.0 lb

## 2014-11-04 DIAGNOSIS — R109 Unspecified abdominal pain: Secondary | ICD-10-CM | POA: Diagnosis not present

## 2014-11-04 MED ORDER — GABAPENTIN 300 MG PO CAPS: ORAL_CAPSULE | ORAL | Status: DC

## 2014-11-04 NOTE — Telephone Encounter (Signed)
Patient's wife called in and needs refill on pain medication as patient is almost out.  Spoke with Dr. Pat Patrick at this time and he would like to see patient tomorrow.  Called patient's wife back at this time and explained just that. She agrees to bring patient in to see Dr. Pat Patrick tomorrow at 3:30pm.

## 2014-11-04 NOTE — Telephone Encounter (Signed)
Returned phone call to patient. Asked to speak with patient, Wife Clearnce Sorrel) says "he is sitting right here, you can talk to me."  Explained that I was calling back to answer any questions and she immediately raises her voice and starts quickly talking. She asked me "why couldn't Tim see a surgeon before he left the hospital? Why did no one call me back on Thursday or Friday?"   I proceeded to tell patient's wife that I did speak with her multiple times on Friday. Then she states, "Oh yeah, I called Dr. Burt Knack on Thursday night and he was no help at all. Then you wanted to see me at 1pm but we went to Urgent Care instead. Then they sent [place] to the Emergency Room. This is ridiculous and I want to see his surgeon today because this was not laparoscopic surgery and I will see Dr. Adonis Huguenin in court."   I explained that I am sorry that they have had a difficult time throughout this process and I can fit her in with another surgeon to see her today. She states, " I want to see his surgeon, today!" I explained that he is unavailable today but another surgeon could take a look at her husband. She began to speak profanities toward me on the phone. Asked wife to hold and called Dr. Burt Knack for advise in this matter.  I reviewed CT scan (that patient had done at [facility] 9/9) with Dr. Burt Knack over the phone at this time and pt does not have an indication to be seen emergently per CT but we can work him in as Dr. Burt Knack is in clinic this week.  Offered once again, She then yells, "I am not seeing another Technical brewer. I am going to see his surgeon so get his ass in the office because he fucked up my husband's surgery and he needs to fix this problem."  I explained that she would have to get Dr. Adonis Huguenin to answer questions when patient is seen in the office and if she would like to see Dr. Adonis Huguenin, his first available appointment will be in the [facility] on 11/20/14 at 10:45am. She states, " You have got to be  kidding me? He messes up his surgery and then we have to wait this long to see him. Just great! You do horrible business and I cannot even believe this."   She continues to tell me how horrible of a surgeon he is and how everything was messed up at the hospital and continued to use very colorful language to express her frustration.   I had to bluntly end the Conversation with, "We will see you at your appointment, Thank you" and hung up the phone as she was continuing to yell profanities.

## 2014-11-04 NOTE — Telephone Encounter (Signed)
Patient left a voicemail on 11/03/2014 - received when office opened morning of 11/04/2014 - patient was very pleasant on message. He stated he would like to speak with Dr Adonis Huguenin, as he had some questions. He stated he was not upset and was pleased with his surgery and Dr Adonis Huguenin, he just had some questions about his side.

## 2014-11-04 NOTE — Progress Notes (Signed)
Subjective:    Patient ID: Eric Snyder, male    DOB: 10-07-1965, 49 y.o.   MRN: [id number] This chart was scribed for [name], MD by Marti Sleigh, Medical Scribe. This patient was seen in Room 8 and the patient's care was started a 3:24 PM.  Chief Complaint  Patient presents with  . Follow-up    post surgery - feels like is something inside maybe 6 in from the incision - very painful  . to talk of his CT results    HPI HPI Comments: Eric Snyder is a 49 y.o. male who presents to [facility] complaining of continued lower right sided abdominal pain and swelling since his hernia surgery. Note last ov and subsequent emergency room visits CT scan. His pain is worse with movement, and becomes severe with slight pressure. He is concerned due to the continued swelling that there is something other than a normal healing process going on.  He has swollen penis and testicles. There is been no urinary retention since just after the surgery. He has no dysuria. There is no fever chills or night sweats. He can't sleep secondary to the pain. He is having to take double his 7.5 mg oxycodone  to get any relief. He still has not had a bowel movement since surgery.  He and his wife are very upset that they have not been able see his surgeon since the surgery with their calls to the office complaining of this pain. They say he is now scheduled for September 28.  Review of Systems  Constitutional: Negative for fever and chills.  Gastrointestinal: Positive for abdominal pain and constipation (Has not had a BM since the surgery).       Abdominal swelling  Genitourinary: Negative for dysuria, urgency and frequency.       Objective:   Physical Exam  Constitutional: He is oriented to person, place, and time. He appears well-developed and well-nourished. No distress.  HENT:  Head: Normocephalic and atraumatic.  Eyes: Pupils are equal, round, and reactive to light.  Neck: Neck supple.    Cardiovascular: Normal rate.   Pulmonary/Chest: Effort normal. No respiratory distress.  Abdominal: There is tenderness.  Incisions are without signs of infection. There is a mild generalized swelling along the right middle quadrant extending to the axillary line that is exquisitely tender to touch, and slightly firm without signs of infection. There is no crepitous, but there is mid swelling in the left groin that is non tender without cellulitis. There is swelling and ecchymosis of the penis and right testicular sac without tenderness. Pain on the right side of abdomen is made worse by any movement.   Musculoskeletal: Normal range of motion.  Neurological: He is alert and oriented to person, place, and time. Coordination normal.  Skin: Skin is warm and dry. He is not diaphoretic.  Psychiatric: He has a normal mood and affect. His behavior is normal.  Nursing note and vitals reviewed.   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 I think he does need a  surgical evaluation from someone who has done this procedure to be sure we are not missing anything.  We can add Neurontin point and I'll refer him back to surgery for evaluation and pain medication refill.   I have completed the patient encounter in its entirety as documented by the scribe, with editing by me where necessary. Arlando Leisinger P. Laney Pastor, M.D.

## 2014-11-04 NOTE — Telephone Encounter (Signed)
Patient left voicemail over the weekend stating that he has some questions about recent surgery. Would like nurse to call back.

## 2014-11-05 ENCOUNTER — Encounter: Payer: Self-pay | Admitting: Surgery

## 2014-11-05 ENCOUNTER — Telehealth: Payer: Self-pay

## 2014-11-05 ENCOUNTER — Telehealth: Payer: Self-pay | Admitting: General Surgery

## 2014-11-05 ENCOUNTER — Ambulatory Visit (INDEPENDENT_AMBULATORY_CARE_PROVIDER_SITE_OTHER): Payer: 59 | Admitting: Surgery

## 2014-11-05 VITALS — BP 145/95 | HR 81 | Temp 98.3°F | Ht 70.0 in | Wt 186.0 lb

## 2014-11-05 DIAGNOSIS — K4020 Bilateral inguinal hernia, without obstruction or gangrene, not specified as recurrent: Secondary | ICD-10-CM

## 2014-11-05 NOTE — Telephone Encounter (Signed)
PATIENT'S WIFE (ALYSON LAYTON) [place] DR. DOOLITTLE TO CALL HER BACK AS SOON AS POSSIBLE. SHE WANTS HIM TO KNOW THAT HER HUSBAND WENT TO [facility] TODAY. DR. Adonis Huguenin DID HIS SURGERY. HE WROTE A PRESCRIPTION FOR DILAUDID 1 MG. THE PHARMACIST CALLED THEM TO SAY THAT THIS DOES NOT EXIST. IT ONLY COMES IN 2 MG OR 4 MG. SHE TRIED TO CALL HIS OFFICE AND THE STAFF TOLD HER SHE NEVER RECEIVED A CALL BACK BECAUSE "HE WAS OFF AND THEY ARE PROTECTING HIS SLEEP." SHE WOULD LIKE TO TALK WITH DR. Laney Pastor ABOUT THIS BECAUSE NOW HER HUSBAND DOES NOT HAVE ANYTHING FOR HIS PAIN. SHE SAID DR. DOOLITTLE KNOWS EVERYTHING THAT IS GOING ON WITH HER HUSBAND. BEST PHONE [phone] (PATIENT'S WIFE IS ALYSON LAYTON)  Roy

## 2014-11-05 NOTE — Telephone Encounter (Signed)
Was made aware of post operative course by nursing staff this morning.  Called patient to check on him at home. No answer. Left message that he is to call the clinic if he has any urgent needs, otherwise I will see him this afternoon at 3:30 in the [place] office.  Clayburn Pert, MD FACS General Surgeon [facility]

## 2014-11-05 NOTE — Progress Notes (Signed)
Outpatient Surgical Follow Up  11/05/2014  Eric Snyder is an 49 y.o. male.   Chief Complaint  Patient presents with  . Routine Post Op    Hernia repair    HPI: He underwent bilateral inguinal hernia repair last week using the preperitoneal laparoscopic approach. He had immediate right sided pain following the surgery with pain office iliac crest. He had significant problems over the next 24 hours including difficulty voiding. He contacted our surgeon on call suggest he follow up with our office or with his primary care physician to evaluate some of his complaints. There was no feeling at the time that the patient had any indication for urgent surgical intervention.  Patient returns today complaining of continued right-sided pain, a large periumbilical incision, bilateral groin pain, and mild swelling in his testicles and penis. His wife was present and they are most upset about his postoperative symptoms and the care received by our office/hospital.  He has no nausea or vomiting currently. He has seen his primary care physician and sent in by ambulance to the [facility]. After waiting a significant amount of time they eloped to [facility] where he underwent a CT scan. Bilateral hematoma/fluid collections were identified and the site of each hernia previously and some air was noted in the subcutaneous space and along the right external oblique internal oblique border. Again no significant surgical indications were identified. I do not have his laboratory values available to me at the present time but they were unremarkable by report.  The patient called the office yesterday asking for a follow-up visit and we set them up for an office visit today. Their primary surgeon, Dr. Adonis Huguenin, came to the office with the intent of following up the patient. The patient and his wife refused to see him commenting that they feel he wasn't competent there were not comfortable with his care. I then  offered to see them and help satisfy some other complaints.  Past Medical History  Diagnosis Date  . Right inguinal hernia   . Medical history non-contributory     Past Surgical History  Procedure Laterality Date  . No past surgeries    . Inguinal hernia repair Bilateral 10/31/2014    Procedure: LAPAROSCOPIC INGUINAL HERNIA with mesh ;  Surgeon: Clayburn Pert, MD;  Location: [facility] ORS;  Service: General;  Laterality: Bilateral;  . Hernia repair      Family History  Problem Relation Age of Onset  . CAD Mother   . CAD Maternal Grandfather     Social History:  reports that he has been smoking Cigarettes.  He has a 30 pack-year smoking history. He has never used smokeless tobacco. He reports that he does not drink alcohol or use illicit drugs.  Allergies: No Known Allergies  Medications reviewed.    Review of Systems  Constitutional: Positive for malaise/fatigue. Negative for fever, chills and weight loss.  HENT: Negative.   Eyes: Negative.   Respiratory: Negative.   Cardiovascular: Negative.   Gastrointestinal: Positive for abdominal pain. Negative for heartburn, nausea and vomiting.  Genitourinary:       Some retention symptoms  Musculoskeletal: Positive for myalgias and back pain.       As noted above right flank pain  Skin: Negative.   Neurological: Negative.   Psychiatric/Behavioral: Negative.       BP 145/95 mmHg  Pulse 81  Temp(Src) 98.3 F (36.8 C) (Oral)  Ht 5\' 10"  (1.778 m)  Wt 84.369 kg (186 lb)  BMI  26.69 kg/m2  Physical Exam  Constitutional: He appears distressed.  Very anxious and upset  HENT:  Head: Normocephalic and atraumatic.  Eyes: Pupils are equal, round, and reactive to light.  Neck: Normal range of motion. Neck supple.  Cardiovascular: Normal rate and regular rhythm.   Pulmonary/Chest: Effort normal and breath sounds normal.  Abdominal: Soft. Bowel sounds are normal. There is tenderness.  He has some mild swelling in the right  inguinal area above the iliac crest. His previous tenderness in that area with some mild guarding. He does not have any rebound and no obvious masses identified. There is some mild discoloration with some yellow blue hues in that area.  Genitourinary:  Mild scrotal bruising and mild discoloration at the base of his penis  Musculoskeletal: Normal range of motion.  Neurological:  Abnormal gait secondary to pain  Skin: Skin is warm and dry.  Psychiatric:  Markedly depressed       No results found for this or any previous visit (from the past 48 hour(s)). No results found.  Assessment/Plan:  1. Bilateral inguinal hernia without obstruction or gangrene, recurrence not specified This injury was most difficult. I spent over 45 minutes with the patient and his wife. They're both very upset talking over each other. Wife was aggressive and very belligerent with her condemnation of the hospital and our practice. I reviewed the CT scan with them on the monitor showing them the changes. Assured them did not appear to be any evidence of significant postoperative surgical complication and there was certainly no evidence of retained foreign body. I did my best to address their office and hospital concerns. I also helped assure them that he should improve over the next 7-10 days. We changed his pain medication to Dilaudid. He's continue his gabapentin cautiously as he may become significantly sedated with both medicines. We have made arrangements for them to follow back up with [place] in the office next week. They were in agreement with this plan and seemed to be more comfortable with our care going forward.     Eric Snyder  11/05/2014,negative

## 2014-11-06 ENCOUNTER — Telehealth: Payer: Self-pay | Admitting: Surgery

## 2014-11-06 MED ORDER — HYDROMORPHONE HCL 2 MG PO TABS: 2.0000 mg | ORAL_TABLET | ORAL | Status: DC | PRN

## 2014-11-06 NOTE — Telephone Encounter (Signed)
Call her--the pharmacist has to call the surgeon and correct this or give him 2mg  tabs to break in half

## 2014-11-06 NOTE — Telephone Encounter (Signed)
Called Pt's wife Valentina Lucks. She stated the the prescription issue has been taken care of and that she just wanted Dr. Laney Pastor to be aware of the situation.

## 2014-11-06 NOTE — Telephone Encounter (Signed)
Called patient to let him know that unfortunately I was not able to fax or e-scribe prescription since it's a narcotic. I told him that needed to stop by the office and pick it up. Patient understood and stated that he would tell his wife.

## 2014-11-06 NOTE — Telephone Encounter (Signed)
[place] and spoke with the pharmacist Hetal and she stated that patient's prescription was written wrong. She explained that Dilaudid 1 MG is only for IV. She stated that Dilaudid 2 MG and 4 MG is usually what is ordered. I told Hetal that I would ask Dr. Pat Patrick which one he will choose. Hetal stated that it would be okay to call her back and let her know which one he chooses and then she could fill it.

## 2014-11-06 NOTE — Telephone Encounter (Signed)
Please call patient/patients wife. Patients wife left a voicemail - they were in the office yesterday (9/13) and saw Dr Pat Patrick. Dr Pat Patrick wrote a prescription for 1mg  Dilaudid. Patient states the pharmacist says the prescription is invalid because Dilaudid needs to be in 2mg  or 4mg . Patients pharmacy is [facility] in [place], [place]  (phone) [phone]  (fax) [phone]. Please call and advise.

## 2014-11-14 ENCOUNTER — Encounter: Payer: Self-pay | Admitting: Surgery

## 2014-11-14 ENCOUNTER — Ambulatory Visit (INDEPENDENT_AMBULATORY_CARE_PROVIDER_SITE_OTHER): Payer: 59 | Admitting: Surgery

## 2014-11-14 VITALS — BP 126/82 | HR 64 | Temp 98.4°F | Ht 70.0 in | Wt 184.0 lb

## 2014-11-14 DIAGNOSIS — K4090 Unilateral inguinal hernia, without obstruction or gangrene, not specified as recurrent: Secondary | ICD-10-CM

## 2014-11-14 NOTE — Progress Notes (Signed)
Outpatient Surgical Follow Up  11/14/2014  Eric Snyder is an 49 y.o. male.   Chief Complaint  Patient presents with  . Routine Post Op    HPI: He returns for follow-up. He has done quite well on the last week. He has no significant abdominal pain now. The discomfort from his groin has resolved. He still has some swelling but overall is much improved. He is eating well with no bowel function problems.  Past Medical History  Diagnosis Date  . Right inguinal hernia   . Medical history non-contributory     Past Surgical History  Procedure Laterality Date  . No past surgeries    . Inguinal hernia repair Bilateral 10/31/2014    Procedure: LAPAROSCOPIC INGUINAL HERNIA with mesh ;  Surgeon: Clayburn Pert, MD;  Location: [facility] ORS;  Service: General;  Laterality: Bilateral;  . Hernia repair      Family History  Problem Relation Age of Onset  . CAD Mother   . CAD Maternal Grandfather     Social History:  reports that he has been smoking Cigarettes.  He has a 30 pack-year smoking history. He has never used smokeless tobacco. He reports that he does not drink alcohol or use illicit drugs.  Allergies: No Known Allergies  Medications reviewed.    ROS    BP 126/82 mmHg  Pulse 64  Temp(Src) 98.4 F (36.9 C) (Oral)  Ht 5\' 10"  (1.778 m)  Wt 83.462 kg (184 lb)  BMI 26.40 kg/m2  Physical Exam still has some swelling in the left groin but his wounds look much better and there is no further tenderness on the right side. He still has full bruising in his scrotum.     No results found for this or any previous visit (from the past 48 hour(s)). No results found.  Assessment/Plan:  1. Unilateral inguinal hernia without obstruction or gangrene, recurrence not specified He is much improved following his surgery. We discussed exercise restrictions asking him to avoid any heavy lifting for another couple of weeks. His wife is still very unhappy with his overall outcome and his  progress but does feel as though he is improving. We will not set up a follow-up visit and they are in agreement with that plan. We will see him back as necessary.     Eric Snyder  11/14/2014,negative

## 2014-11-14 NOTE — Patient Instructions (Signed)
Please give us a call if you have any questions or concerns. 

## 2014-11-20 ENCOUNTER — Ambulatory Visit: Payer: Self-pay | Admitting: General Surgery

## 2014-12-24 ENCOUNTER — Ambulatory Visit (INDEPENDENT_AMBULATORY_CARE_PROVIDER_SITE_OTHER): Payer: 59 | Admitting: Emergency Medicine

## 2014-12-24 VITALS — BP 122/76 | HR 63 | Temp 98.3°F | Resp 16 | Ht 71.0 in | Wt 188.8 lb

## 2014-12-24 DIAGNOSIS — Z23 Encounter for immunization: Secondary | ICD-10-CM

## 2014-12-24 DIAGNOSIS — H5712 Ocular pain, left eye: Secondary | ICD-10-CM

## 2014-12-24 NOTE — Progress Notes (Signed)
Subjective:  Patient ID: Eric Snyder, male    DOB: 03/03/1965  Age: 49 y.o. 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 He also has a redness in his sclera. The symptoms have developed today. Lower and upper lid edema. He went by his ophthalmologist office and I was sent here to be referred to them. He has no visual symptoms. No blurred vision or double vision or difficulty with acuity. He says he wears safety glasses at work and goggles when he rides a motorcycle  History Eric Snyder has a past medical history of Right inguinal hernia and Medical history non-contributory.   He has past surgical history that includes No past surgeries; Inguinal hernia repair (Bilateral, 10/31/2014); and Hernia repair.   His  family history includes CAD in his maternal grandfather and mother.  He   reports that he has been smoking Cigarettes.  He has a 30 pack-year smoking history. He has never used smokeless tobacco. He reports that he does not drink alcohol or use illicit drugs.  Outpatient Prescriptions Prior to Visit  Medication Sig Dispense Refill  . Cholecalciferol (VITAMIN D-3 PO) Take 1 tablet by mouth daily.    . diclofenac (VOLTAREN) 25 MG EC tablet Take 25 mg by mouth 2 (two) times daily.     No facility-administered medications prior to visit.    Social History   Social History  . Marital Status: Married    Spouse Name: N/A  . Number of Children: N/A  . Years of Education: N/A   Social History Main Topics  . Smoking status: Current Every Day Smoker -- 1.00 packs/day for 30 years    Types: Cigarettes  . Smokeless tobacco: Never Used  . Alcohol Use: No  . Drug Use: No  . Sexual Activity: Yes   Other Topics Concern  . None   Social History Narrative     Review of Systems  Constitutional: Negative for fever, chills and appetite change.  HENT: Negative for congestion, ear  pain, postnasal drip, sinus pressure and sore throat.   Eyes: Positive for pain and redness. Negative for photophobia and visual disturbance.  Respiratory: Negative for cough, shortness of breath and wheezing.   Cardiovascular: Negative for leg swelling.  Gastrointestinal: Negative for nausea, vomiting, abdominal pain, diarrhea, constipation and blood in stool.  Endocrine: Negative for polyuria.  Genitourinary: Negative for dysuria, urgency, frequency and flank pain.  Musculoskeletal: Negative for gait problem.  Skin: Negative for rash.  Neurological: Negative for weakness and headaches.  Psychiatric/Behavioral: Negative for confusion and decreased concentration. The patient is not nervous/anxious.     Objective:  BP 122/76 mmHg  Pulse 63  Temp(Src) 98.3 F (36.8 C) (Oral)  Resp 16  Ht 5\' 11"  (1.803 m)  Wt 188 lb 12.8 oz (85.639 kg)  BMI 26.34 kg/m2  SpO2 98%  Physical Exam  Constitutional: He is oriented to person, place, and time. He appears well-developed and well-nourished.  HENT:  Head: Normocephalic and atraumatic.  Eyes: Pupils are equal, round, and reactive to light. Lids are everted and swept, no foreign bodies found. Right eye exhibits no exudate. No foreign body present in the right eye. Right conjunctiva is injected. Right conjunctiva has no hemorrhage. Right eye exhibits normal extraocular motion. Right pupil is round and reactive.  Fluorescein stain was negative there is no visible foreign body  Pulmonary/Chest: Effort normal.  Musculoskeletal:  He exhibits no edema.  Neurological: He is alert and oriented to person, place, and time.  Skin: Skin is dry.  Psychiatric: He has a normal mood and affect. His behavior is normal. Thought content normal.      Assessment & Plan:   Eric Snyder was seen today for eye pain and flu vaccine.  Diagnoses and all orders for this visit:  Acute left eye pain -     Ambulatory referral to Ophthalmology -     Flu Vaccine QUAD 36+  mos IM  Needs flu shot -     Ambulatory referral to Ophthalmology -     Flu Vaccine QUAD 36+ mos IM   I am having Mr. Eric Snyder maintain his Cholecalciferol (VITAMIN D-3 PO) and diclofenac.  No orders of the defined types were placed in this encounter.   He was referred to Dr. Idolina Primer his eye doctor. He has an appointment at 3:00  Appropriate red flag conditions were discussed with the patient as well as actions that should be taken.  Patient expressed his understanding.  Follow-up: Return if symptoms worsen or fail to improve.  Roselee Culver, MD

## 2015-01-20 ENCOUNTER — Encounter: Payer: Self-pay | Admitting: Internal Medicine

## 2015-03-05 ENCOUNTER — Ambulatory Visit (INDEPENDENT_AMBULATORY_CARE_PROVIDER_SITE_OTHER): Payer: BLUE CROSS/BLUE SHIELD | Admitting: Internal Medicine

## 2015-03-05 ENCOUNTER — Other Ambulatory Visit: Payer: Self-pay | Admitting: Internal Medicine

## 2015-03-05 ENCOUNTER — Encounter: Payer: Self-pay | Admitting: Internal Medicine

## 2015-03-05 VITALS — BP 125/85 | HR 64 | Temp 98.2°F | Resp 18 | Ht 71.0 in | Wt 191.0 lb

## 2015-03-05 DIAGNOSIS — R946 Abnormal results of thyroid function studies: Secondary | ICD-10-CM

## 2015-03-05 DIAGNOSIS — R7989 Other specified abnormal findings of blood chemistry: Secondary | ICD-10-CM

## 2015-03-05 DIAGNOSIS — R5382 Chronic fatigue, unspecified: Secondary | ICD-10-CM

## 2015-03-05 DIAGNOSIS — Z8349 Family history of other endocrine, nutritional and metabolic diseases: Secondary | ICD-10-CM

## 2015-03-05 LAB — CBC WITH DIFFERENTIAL/PLATELET
BASOS ABS: 0.1 10*3/uL (ref 0.0–0.1)
BASOS PCT: 1 % (ref 0–1)
EOS ABS: 0.2 10*3/uL (ref 0.0–0.7)
EOS PCT: 2 % (ref 0–5)
HCT: 46.8 % (ref 39.0–52.0)
Hemoglobin: 16.3 g/dL (ref 13.0–17.0)
Lymphocytes Relative: 28 % (ref 12–46)
Lymphs Abs: 3.1 10*3/uL (ref 0.7–4.0)
MCH: 30.7 pg (ref 26.0–34.0)
MCHC: 34.8 g/dL (ref 30.0–36.0)
MCV: 88.1 fL (ref 78.0–100.0)
MONO ABS: 0.8 10*3/uL (ref 0.1–1.0)
MPV: 9.8 fL (ref 8.6–12.4)
Monocytes Relative: 7 % (ref 3–12)
Neutro Abs: 6.8 10*3/uL (ref 1.7–7.7)
Neutrophils Relative %: 62 % (ref 43–77)
PLATELETS: 229 10*3/uL (ref 150–400)
RBC: 5.31 MIL/uL (ref 4.22–5.81)
RDW: 13.8 % (ref 11.5–15.5)
WBC: 10.9 10*3/uL — AB (ref 4.0–10.5)

## 2015-03-05 NOTE — Progress Notes (Signed)
Here for further consideration of risk for hemochromatosis due to strong fam Hx Has had recent 1-56mo problem with fatigue No bruising or bleeding No fever or night  Sweats No wt loss Wife is stressed with severe chr illness and he's caretaker 3 family members with Hemochr-liver failure/2 dead  Has recov from hern repair finally No meds or chr illn  Exam BP 125/85 mmHg  Pulse 64  Temp(Src) 98.2 F (36.8 C)  Resp 18  Ht 5\' 11"  (1.803 m)  Wt 191 lb (86.637 kg)  BMI 26.65 kg/m2 HEENT clear CV-neg Resp-stable Liver nonpalp extr clear Skin clear Neuro intact  IMP Chronic fatigue - Plan: Hemochromatosis DNA-PCR(c282y,h63d), CBC with Differential/Platelet, Ferritin, IBC Panel, Iron and TIBC, TSH, Comprehensive metabolic panel  FHx: hemochromatosis  Notify labs

## 2015-03-06 LAB — IRON AND TIBC
%SAT: 48 % (ref 15–60)
IRON: 143 ug/dL (ref 50–180)
TIBC: 301 ug/dL (ref 250–425)
UIBC: 158 ug/dL (ref 125–400)

## 2015-03-06 LAB — COMPREHENSIVE METABOLIC PANEL
ALBUMIN: 4.2 g/dL (ref 3.6–5.1)
ALK PHOS: 57 U/L (ref 40–115)
ALT: 26 U/L (ref 9–46)
AST: 22 U/L (ref 10–40)
BUN: 14 mg/dL (ref 7–25)
CHLORIDE: 105 mmol/L (ref 98–110)
CO2: 23 mmol/L (ref 20–31)
Calcium: 9 mg/dL (ref 8.6–10.3)
Creat: 1.07 mg/dL (ref 0.60–1.35)
Glucose, Bld: 91 mg/dL (ref 65–99)
POTASSIUM: 4.1 mmol/L (ref 3.5–5.3)
Sodium: 139 mmol/L (ref 135–146)
TOTAL PROTEIN: 6.4 g/dL (ref 6.1–8.1)
Total Bilirubin: 0.5 mg/dL (ref 0.2–1.2)

## 2015-03-06 LAB — TSH: TSH: 5.321 u[IU]/mL — AB (ref 0.350–4.500)

## 2015-03-06 LAB — T4, FREE: Free T4: 1.04 ng/dL (ref 0.80–1.80)

## 2015-03-06 LAB — FERRITIN: FERRITIN: 23 ng/mL (ref 22–322)

## 2015-03-10 LAB — HEMOCHROMATOSIS DNA-PCR(C282Y,H63D)

## 2015-04-30 ENCOUNTER — Other Ambulatory Visit: Payer: Self-pay | Admitting: Internal Medicine

## 2015-04-30 MED ORDER — AZITHROMYCIN 250 MG PO TABS: ORAL_TABLET | ORAL | Status: DC

## 2015-05-26 ENCOUNTER — Ambulatory Visit (INDEPENDENT_AMBULATORY_CARE_PROVIDER_SITE_OTHER): Payer: BLUE CROSS/BLUE SHIELD | Admitting: Physician Assistant

## 2015-05-26 ENCOUNTER — Encounter: Payer: Self-pay | Admitting: Family Medicine

## 2015-05-26 VITALS — BP 136/94 | HR 76 | Temp 97.7°F | Resp 17 | Ht 70.0 in | Wt 191.0 lb

## 2015-05-26 DIAGNOSIS — R030 Elevated blood-pressure reading, without diagnosis of hypertension: Secondary | ICD-10-CM

## 2015-05-26 DIAGNOSIS — Z23 Encounter for immunization: Secondary | ICD-10-CM | POA: Diagnosis not present

## 2015-05-26 DIAGNOSIS — IMO0001 Reserved for inherently not codable concepts without codable children: Secondary | ICD-10-CM

## 2015-05-26 NOTE — Patient Instructions (Signed)
     IF you received an x-ray today, you will receive an invoice from [facility]. Please contact [facility] at [phone] with questions or concerns regarding your invoice.   IF you received labwork today, you will receive an invoice from [facility]. Please contact [facility] at [phone] with questions or concerns regarding your invoice.   Our billing staff will not be able to assist you with questions regarding bills from these companies.  You will be contacted with the lab results as soon as they are available. The fastest way to get your results is to activate your My Chart account. Instructions are located on the last page of this paperwork. If you have not heard from us regarding the results in 2 weeks, please contact this office.      

## 2015-05-26 NOTE — Progress Notes (Signed)
   05/26/2015 4:50 PM   DOB: 06/06/1965 / MRN: [id number]  SUBJECTIVE:  Eric Snyder is a 50 y.o. male presenting for a TDAP.  Reports his daughter in law is having a child and he can not remember the last time he had a TDAP.  States he had a TD 8 years ago.  He feels well today and has no complaints.   He has No Known Allergies.   He  has a past medical history of Right inguinal hernia and Medical history non-contributory.    He  reports that he has been smoking Cigarettes.  He has a 30 pack-year smoking history. He has never used smokeless tobacco. He reports that he does not drink alcohol or use illicit drugs. He  reports that he currently engages in sexual activity. The patient  has past surgical history that includes No past surgeries; Inguinal hernia repair (Bilateral, 10/31/2014); and Hernia repair.  His family history includes CAD in his maternal grandfather and mother.  Review of Systems  Constitutional: Negative for fever and chills.  Eyes: Negative for blurred vision.  Respiratory: Negative for cough and shortness of breath.   Cardiovascular: Negative for chest pain.  Gastrointestinal: Negative for nausea and abdominal pain.  Genitourinary: Negative for dysuria, urgency and frequency.  Musculoskeletal: Negative for myalgias.  Skin: Negative for rash.  Neurological: Negative for dizziness, tingling and headaches.  Psychiatric/Behavioral: Negative for depression. The patient is not nervous/anxious.     Problem list and medications reviewed and updated by myself where necessary, and exist elsewhere in the encounter.   OBJECTIVE:  BP 136/94 mmHg  Pulse 76  Temp(Src) 97.7 F (36.5 C) (Oral)  Resp 17  Ht 5\' 10"  (1.778 m)  Wt 191 lb (86.637 kg)  BMI 27.41 kg/m2  SpO2 96%  Physical Exam  Constitutional: He is oriented to person, place, and time. He appears well-developed. He does not appear ill.  Eyes: Conjunctivae and EOM are normal. Pupils are equal, round, and reactive  to light.  Cardiovascular: Normal rate.   Pulmonary/Chest: Effort normal.  Abdominal: He exhibits no distension.  Musculoskeletal: Normal range of motion.  Neurological: He is alert and oriented to person, place, and time. No cranial nerve deficit. Coordination normal.  Skin: Skin is warm and dry. He is not diaphoretic.  Psychiatric: He has a normal mood and affect.  Nursing note and vitals reviewed.   No results found for this or any previous visit (from the past 72 hour(s)).  No results found.  ASSESSMENT AND PLAN  Eric Snyder was seen today for immunizations.  Diagnoses and all orders for this visit:  Need for Tdap vaccination -     Tdap vaccine greater than or equal to 7yo IM  Elevated BP: Advised that he monitor this on an outpatient basis.  If greater than 140/90 consistently then RTC for work up.      The patient was advised to call or return to clinic if he does not see an improvement in symptoms or to seek the care of the closest emergency department if he worsens with the above plan.   Philis Fendt, MHS, PA-C [facility] 05/26/2015 4:50 PM

## 2015-07-22 ENCOUNTER — Ambulatory Visit (INDEPENDENT_AMBULATORY_CARE_PROVIDER_SITE_OTHER): Payer: BLUE CROSS/BLUE SHIELD | Admitting: Internal Medicine

## 2015-07-22 VITALS — BP 122/76 | HR 66 | Temp 98.1°F | Resp 16 | Ht 71.0 in | Wt 187.0 lb

## 2015-07-22 DIAGNOSIS — J0100 Acute maxillary sinusitis, unspecified: Secondary | ICD-10-CM

## 2015-07-22 MED ORDER — AMOXICILLIN 875 MG PO TABS: 875.0000 mg | ORAL_TABLET | Freq: Two times a day (BID) | ORAL | Status: DC

## 2015-07-22 NOTE — Patient Instructions (Addendum)
12 hour sudafed twice a day for 5 days to 7 days    IF you received an x-ray today, you will receive an invoice from [facility]. Please contact [facility] at [phone] with questions or concerns regarding your invoice.   IF you received labwork today, you will receive an invoice from [employer]. Please contact [facility] at [phone] with questions or concerns regarding your invoice.   Our billing staff will not be able to assist you with questions regarding bills from these companies.  You will be contacted with the lab results as soon as they are available. The fastest way to get your results is to activate your My Chart account. Instructions are located on the last page of this paperwork. If you have not heard from [place] regarding the results in 2 weeks, please contact this office.

## 2015-07-22 NOTE — Progress Notes (Signed)
   Subjective:  By signing my name below, I, Moises Blood, attest that this documentation has been prepared under the direction and in the presence of [name], MD. Electronically Signed: Moises Blood, [place]. 07/22/2015 , 4:49 PM .  Patient was seen in Room 1 .   Patient ID: Eric Snyder, male    DOB: 23-Jun-1965, 50 y.o.   MRN: [id number] Chief Complaint  Patient presents with  . Foot Injury    both, x 1 day   . Nasal Congestion    x 5-6 days   . Cough  . chest congestion  . Rash    on back    HPI Eric Snyder is a 50 y.o. male who presents to [facility] complaining of nasal congestion with cough, sinus congestion and chest congestion that started 5-6 days ago. He also notes having cold sweats but denies any fever. His wife has similar symptoms. Can't sleep well due to congestion  He also mentions bruising over his feet bilaterally noted yesterday. He believes it's due to work. NKI  Patient Active Problem List   Diagnosis Date Noted  . Bilateral inguinal hernia 10/16/2014  . Left inguinal hernia 12/29/2012    Current outpatient prescriptions:  .  Cholecalciferol (VITAMIN D-3 PO), Take 1 tablet by mouth daily., Disp: , Rfl:  .  diclofenac (VOLTAREN) 25 MG EC tablet, Take 25 mg by mouth 2 (two) times daily. Reported on 05/26/2015, Disp: , Rfl:  No Known Allergies   Review of Systems  Constitutional: Positive for chills, diaphoresis and fatigue. Negative for fever.  HENT: Positive for congestion. Negative for sore throat.   Respiratory: Positive for cough. Negative for shortness of breath and wheezing.   Gastrointestinal: Negative for nausea, vomiting and diarrhea.  Musculoskeletal: Positive for myalgias.       Objective:   Physical Exam  Constitutional: He is oriented to person, place, and time. He appears well-developed and well-nourished. No distress.  HENT:  Head: Normocephalic and atraumatic.  Nose: Rhinorrhea present.  Purulent discharge in his nares  bilaterally  Eyes: EOM are normal. Pupils are equal, round, and reactive to light.  Neck: Neck supple. No thyromegaly present.  Cardiovascular: Normal rate.   Pulmonary/Chest: Effort normal and breath sounds normal. No respiratory distress.  Musculoskeletal: Normal range of motion.  Lymphadenopathy:    He has no cervical adenopathy.  Neurological: He is alert and oriented to person, place, and time.  Skin: Skin is warm and dry.  symm bruising forefeet from shoes  Psychiatric: He has a normal mood and affect. His behavior is normal.  Nursing note and vitals reviewed.   BP 122/76 mmHg  Pulse 66  Temp(Src) 98.1 F (36.7 C) (Oral)  Resp 16  Ht 5\' 11"  (1.803 m)  Wt 187 lb (84.823 kg)  BMI 26.09 kg/m2  SpO2 96%    Assessment & Plan:  Acute maxillary sinusitis, recurrence not specified  Meds ordered this encounter  Medications  . amoxicillin (AMOXIL) 875 MG tablet    Sig: Take 1 tablet (875 mg total) by mouth 2 (two) times daily.    Dispense:  20 tablet    Refill:  0   I have completed the patient encounter in its entirety as documented by the scribe, with editing by me where necessary. Allayna Erlich P. Laney Pastor, M.D.

## 2015-08-04 ENCOUNTER — Encounter: Payer: Self-pay | Admitting: Internal Medicine

## 2015-08-06 NOTE — Telephone Encounter (Signed)
Is there any way to help him with this retroactive referral?

## 2015-08-06 NOTE — Telephone Encounter (Signed)
It looks like Dr Laney Pastor meant to send this to Referrals.

## 2015-11-24 ENCOUNTER — Emergency Department (HOSPITAL_COMMUNITY): Payer: BLUE CROSS/BLUE SHIELD

## 2015-11-24 ENCOUNTER — Encounter (HOSPITAL_COMMUNITY): Payer: Self-pay

## 2015-11-24 ENCOUNTER — Emergency Department (HOSPITAL_COMMUNITY)
Admission: EM | Admit: 2015-11-24 | Discharge: 2015-11-25 | Disposition: A | Discharge status: Home / Self Care | Payer: BLUE CROSS/BLUE SHIELD | Attending: Emergency Medicine | Admitting: Emergency Medicine

## 2015-11-24 DIAGNOSIS — M5416 Radiculopathy, lumbar region: Secondary | ICD-10-CM | POA: Diagnosis not present

## 2015-11-24 DIAGNOSIS — F17210 Nicotine dependence, cigarettes, uncomplicated: Secondary | ICD-10-CM | POA: Insufficient documentation

## 2015-11-24 DIAGNOSIS — M545 Low back pain, unspecified: Secondary | ICD-10-CM

## 2015-11-24 LAB — URINALYSIS, ROUTINE W REFLEX MICROSCOPIC
Glucose, UA: NEGATIVE mg/dL
Hgb urine dipstick: NEGATIVE
KETONES UR: NEGATIVE mg/dL
Leukocytes, UA: NEGATIVE
NITRITE: NEGATIVE
PH: 6 (ref 5.0–8.0)
Protein, ur: NEGATIVE mg/dL
Specific Gravity, Urine: 1.031 — ABNORMAL HIGH (ref 1.005–1.030)

## 2015-11-24 LAB — CBC
HEMATOCRIT: 44.6 % (ref 39.0–52.0)
Hemoglobin: 14.9 g/dL (ref 13.0–17.0)
MCH: 31 pg (ref 26.0–34.0)
MCHC: 33.4 g/dL (ref 30.0–36.0)
MCV: 92.7 fL (ref 78.0–100.0)
PLATELETS: 194 10*3/uL (ref 150–400)
RBC: 4.81 MIL/uL (ref 4.22–5.81)
RDW: 13 % (ref 11.5–15.5)
WBC: 11.7 10*3/uL — ABNORMAL HIGH (ref 4.0–10.5)

## 2015-11-24 LAB — BASIC METABOLIC PANEL
Anion gap: 7 (ref 5–15)
BUN: 16 mg/dL (ref 6–20)
CALCIUM: 8.9 mg/dL (ref 8.9–10.3)
CO2: 22 mmol/L (ref 22–32)
CREATININE: 0.92 mg/dL (ref 0.61–1.24)
Chloride: 109 mmol/L (ref 101–111)
GFR calc non Af Amer: >60 mL/min (ref >60)
Glucose, Bld: 104 mg/dL — ABNORMAL HIGH (ref 65–99)
Potassium: 3.8 mmol/L (ref 3.5–5.1)
SODIUM: 138 mmol/L (ref 135–145)

## 2015-11-24 MED ORDER — DEXAMETHASONE SODIUM PHOSPHATE 10 MG/ML IJ SOLN
10.0000 mg | Freq: Once | INTRAMUSCULAR | Status: AC
Start: 2015-11-24 — End: 2015-11-24
  Administered 2015-11-24: 10 mg via INTRAVENOUS
  Filled 2015-11-24: qty 1

## 2015-11-24 MED ORDER — SODIUM CHLORIDE 0.9 % IV BOLUS (SEPSIS)
1000.0000 mL | Freq: Once | INTRAVENOUS | Status: AC
  Administered 2015-11-24: 1000 mL via INTRAVENOUS

## 2015-11-24 MED ORDER — DIAZEPAM 5 MG/ML IJ SOLN
5.0000 mg | Freq: Once | INTRAMUSCULAR | Status: AC
  Administered 2015-11-24: 5 mg via INTRAVENOUS
  Filled 2015-11-24: qty 2

## 2015-11-24 MED ORDER — KETOROLAC TROMETHAMINE 30 MG/ML IJ SOLN
30.0000 mg | Freq: Once | INTRAMUSCULAR | Status: AC
Start: 2015-11-24 — End: 2015-11-24
  Administered 2015-11-24: 30 mg via INTRAVENOUS
  Filled 2015-11-24: qty 1

## 2015-11-24 NOTE — ED Notes (Signed)
Patient transported to MRI 

## 2015-11-24 NOTE — ED Notes (Signed)
repaged Neurosurgery Christella Noa) to Lucio Edward @[phone] 

## 2015-11-24 NOTE — ED Triage Notes (Signed)
Per Pt's Family. Pt come from [facility] after extreme pain noted bilaterally to both legs after he is unable to walk. Pt reports hx of slip disc. Pt denies incontinence.

## 2015-11-24 NOTE — ED Triage Notes (Addendum)
Dr. Ron Agee from [facility] sent pt from his office today. The pt presented to his office this morning with acute, severe increase in pain after a sudden movement yesterday. Dr. Ron Agee gave the pt steroid injection in the office but was unable to calm the pt down, he then referred the pt to ED for possible imaging and consult with neurosurgery if indicated. Dr Ron Agee did discuss the case with dr Christella Noa prior to sending the pt to ED

## 2015-11-24 NOTE — ED Provider Notes (Signed)
[facility] DEPT Provider Note   CSN: [id number] Arrival date & time: 11/24/15  1253     History   Chief Complaint Chief Complaint  Patient presents with  . Back Pain    HPI Eric Snyder is a 50 y.o. male.  HPI   Patient is a 49 year old male with history of bilateral inguinal hernia status post repair, also a lumbar disc herniation 2015, presents to the ER with chief complaint of severe low back pain with radiation down his bilateral legs and associated numbness, tingling and inability to walk. He was sent to the ER by his orthopedic surgeon after being evaluated earlier today, patient was unable to ambulate despite steroid and muscle relaxer injections. The orthopedic surgeon discussed the patient with the neurosurgeon and he sent to the ER for imaging and evaluation.  He states that once 2 days ago he bent over and felt a twinge in his back and then had severe low back pain with radiation down both his legs with associated numbness. He feels a shooting more so into his right thigh, and a sharp, stabbing and burning, rated 10 out of 10.  He had some mild relief with injections at the doctor's office stating that he is no longer numb in his lower extremities but he has persistent pain. The severe pain as been causing nausea and vomiting and he has been unable to take home Percocet. No other medications attempted prior to arrival.  He continues to be unable to ambulate and pain is best controlled with laying on his right side crawled in the fetal position. Pain is worse with any movements.  He denies incontinence of stool or bladder. No urinary retention, he denies abdominal pain, CP, SOB.  No fever.  No hx of IVDU, personal CA hx, or chronic steroid use.  Past Medical History:  Diagnosis Date  . Medical history non-contributory   . Right inguinal hernia     Patient Active Problem List   Diagnosis Date Noted  . Bilateral inguinal hernia 10/16/2014  . Left inguinal hernia  12/29/2012    Past Surgical History:  Procedure Laterality Date  . HERNIA REPAIR    . INGUINAL HERNIA REPAIR Bilateral 10/31/2014   Procedure: LAPAROSCOPIC INGUINAL HERNIA with mesh ;  Surgeon: Clayburn Pert, MD;  Location: [facility] ORS;  Service: General;  Laterality: Bilateral;  . NO PAST SURGERIES         Home Medications    Prior to Admission medications   Medication Sig Start Date End Date Taking? Authorizing Provider  Ascorbic Acid (VITA-C PO) Take 2 each by mouth daily.   Yes Historical Provider, MD  Cholecalciferol (VITAMIN D-3 PO) Take 1 tablet by mouth daily.   Yes Historical Provider, MD  diclofenac (VOLTAREN) 25 MG EC tablet Take 25 mg by mouth 2 (two) times daily. Reported on 05/26/2015   Yes Historical Provider, MD  MAGNESIUM PO Take 1 tablet by mouth daily as needed (for muscles).   Yes Historical Provider, MD  oxyCODONE-acetaminophen (PERCOCET/ROXICET) 5-325 MG tablet Take 1 tablet by mouth every 4 (four) hours as needed for severe pain.   Yes Historical Provider, MD  amoxicillin (AMOXIL) 875 MG tablet Take 1 tablet (875 mg total) by mouth 2 (two) times daily. Patient not taking: Reported on 11/24/2015 07/22/15   Leandrew Koyanagi, MD  cyclobenzaprine (FLEXERIL) 10 MG tablet Take 1 tablet (10 mg total) by mouth 2 (two) times daily as needed for muscle spasms. 11/25/15   Delsa Grana, PA-C  ondansetron (  ZOFRAN ODT) 4 MG disintegrating tablet Take 1 tablet (4 mg total) by mouth every 8 (eight) hours as needed for nausea or vomiting. 11/25/15   Delsa Grana, PA-C  oxyCODONE-acetaminophen (PERCOCET) 5-325 MG tablet Take 1 tablet by mouth every 4 (four) hours as needed. 11/25/15   Delsa Grana, PA-C  predniSONE (DELTASONE) 20 MG tablet Take 2 tablets (40 mg total) by mouth daily. Take 40 mg by mouth daily for 3 days, then 20mg  by mouth daily for 3 days, then 10mg  daily for 3 days 11/25/15   Delsa Grana, PA-C    Family History Family History  Problem Relation Age of Onset  . CAD Mother    . CAD Maternal Grandfather     Social History Social History  Substance Use Topics  . Smoking status: Current Every Day Smoker    Packs/day: 1.00    Years: 30.00    Types: Cigarettes  . Smokeless tobacco: Never Used  . Alcohol use No     Allergies   Review of patient's allergies indicates no known allergies.   Review of Systems Review of Systems  All other systems reviewed and are negative.    Physical Exam Updated Vital Signs BP 114/60   Pulse (!) 54   Temp 97.8 F (36.6 C) (Oral)   Resp 13   Ht 5' 10.5" (1.791 m)   Wt 83.9 kg   SpO2 93%   BMI 26.17 kg/m   Physical Exam  Constitutional: He is oriented to person, place, and time. He appears well-developed and well-nourished. No distress.  HENT:  Head: Normocephalic and atraumatic.  Right Ear: External ear normal.  Left Ear: External ear normal.  Nose: Nose normal.  Mouth/Throat: Oropharynx is clear and moist. No oropharyngeal exudate.  Eyes: Conjunctivae and EOM are normal. Pupils are equal, round, and reactive to light. Right eye exhibits no discharge. Left eye exhibits no discharge. No scleral icterus.  Neck: Normal range of motion. Neck supple. No JVD present. No tracheal deviation present.  Cardiovascular: Normal rate, regular rhythm, normal heart sounds and intact distal pulses.  Exam reveals no gallop and no friction rub.   No murmur heard. Pulmonary/Chest: Effort normal and breath sounds normal. No stridor. No respiratory distress. He has no wheezes. He has no rales. He exhibits no tenderness.  Abdominal: Soft. Bowel sounds are normal. He exhibits no distension and no mass. There is no tenderness. There is no rebound and no guarding.  Musculoskeletal: He exhibits edema and tenderness.       Lumbar back: He exhibits decreased range of motion, tenderness, bony tenderness and spasm.  Lymphadenopathy:    He has no cervical adenopathy.  Neurological: He is alert and oriented to person, place, and time. A  sensory deficit is present. No cranial nerve deficit. He exhibits normal muscle tone. Gait (unable to ambulate secondary to pain and parasthesias) abnormal. Coordination normal. GCS eye subscore is 4. GCS verbal subscore is 5. GCS motor subscore is 6.  Bilateral LE normal sensation to light touch, decreased differentiation of sharp/dull 5/5 strength bilaterally with plantar flexion and dorsiflexion Antalgic gait, pt will not bear full weight, even with two-person assist,  secondary to pain,    Skin: Skin is warm and dry. No rash noted. He is not diaphoretic. No erythema. No pallor.  Psychiatric: He has a normal mood and affect. His behavior is normal. Judgment and thought content normal.  Nursing note and vitals reviewed.    ED Treatments / Results  Labs (all labs  ordered are listed, but only abnormal results are displayed) Labs Reviewed  CBC - Abnormal; Notable for the following:       Result Value   WBC 11.7 (*)    All other components within normal limits  BASIC METABOLIC PANEL - Abnormal; Notable for the following:    Glucose, Bld 104 (*)    All other components within normal limits  URINALYSIS, ROUTINE W REFLEX MICROSCOPIC (NOT AT [facility]) - Abnormal; Notable for the following:    Color, Urine AMBER (*)    Specific Gravity, Urine 1.031 (*)    Bilirubin Urine SMALL (*)    All other components within normal limits    EKG  EKG Interpretation None       Radiology Mr Lumbar Spine Wo Contrast  Result Date: 11/24/2015 CLINICAL DATA:  Inability to walk.  Back pain and leg pain. EXAM: MRI LUMBAR SPINE WITHOUT CONTRAST TECHNIQUE: Multiplanar, multisequence MR imaging of the lumbar spine was performed. No intravenous contrast was administered. COMPARISON:  None. FINDINGS: Segmentation: Normal. The lowest disc space is considered to be L5-S1. Alignment:  Normal Vertebrae: No acute compression fracture, facet edema or focal marrow lesion. Conus medullaris: Extends to the L1 level and  appears normal. Paraspinal and other soft tissues: The visualized aorta, IVC and iliac vessels are normal. The visualized retroperitoneal organs and paraspinal soft tissues are normal. Disc levels: T11-L1: Evaluated on sagittal images only. No significant disc herniation, spinal canal stenosis or neuroforaminal narrowing. L1-L2: Normal disc space and facet joints. No spinal canal stenosis. No neuroforaminal stenosis. L2-L3: Normal disc space and facet joints. No spinal canal stenosis. No neuroforaminal stenosis. L3-L4: There is disc desiccation with a left subarticular disc protrusion narrowing the left lateral recess. This displaces the descending left L4 nerve root. No spinal canal stenosis. No neuroforaminal stenosis. L4-L5: There is disc desiccation with a central disc protrusion annular fissure. No spinal canal stenosis. No neuroforaminal stenosis. L5-S1: There is a medium-sized right subarticular disc extrusion with minimal superior migration. This narrows the right lateral recess and displaces the descending right S1 nerve root. No central spinal canal stenosis. No neuroforaminal stenosis. IMPRESSION: 1. Medium-sized right subarticular disc extrusion at L5-S1, narrowing the right lateral recess with displacement of the descending right S1 nerve root, which may be a source of right S1 radiculopathy. 2. Small left subarticular disc protrusion at L3-L4, narrowing the left lateral recess and displacing the descending left L4 nerve root, which may be a source of left L4 radiculopathy. 3. Central disc protrusion with annular fissure at L4-5 without associated stenosis. Electronically Signed   By: Ulyses Jarred M.D.   On: 11/24/2015 21:31    Procedures Procedures (including critical care time)  Medications Ordered in ED Medications  ondansetron (ZOFRAN) injection 4 mg (not administered)  cyclobenzaprine (FLEXERIL) tablet 10 mg (not administered)  oxyCODONE-acetaminophen (PERCOCET/ROXICET) 5-325 MG per  tablet 1 tablet (not administered)  sodium chloride 0.9 % bolus 1,000 mL (0 mLs Intravenous Stopped 11/25/15 0051)  ketorolac (TORADOL) 30 MG/ML injection 30 mg (30 mg Intravenous Given 11/24/15 1950)  dexamethasone (DECADRON) injection 10 mg (10 mg Intravenous Given 11/24/15 1951)  diazepam (VALIUM) injection 5 mg (5 mg Intravenous Given 11/24/15 1952)     Initial Impression / Assessment and Plan / ED Course  I have reviewed the triage vital signs and the nursing notes.  Pertinent labs & imaging results that were available during my care of the patient were reviewed by me and considered in my medical decision making (  see chart for details).  Clinical Course   Pt with acute on chronic back pain, sent to the ER by ortho for MRI lumbar spine, ortho did discuss pt with Dr. Christella Noa.  MRI resulted and Dr. Christella Noa reviewed imaging, no emergent findings, pt does not want to be admitted for surgery tomorrow, even though Dr. Christella Noa offered.  The patient reports improving symptoms in his bilateral lower extremities although he does continue to have pain.  He is able to bear partial weight with leaning on the ER bed and take a few steps.  He states he will ambulate with crutches these have been ordered and provided for him.  He is able to ambulate well with the assistance of crutches.  He was given muscle relaxers, low-dose steroid taper, pain medication and antinausea medication. He was instructed to follow-up with Dr. Christella Noa tomorrow in the office.  He was discharged in good condition with stable vital signs, tolerating PO's, and able to ambulate with the assistance of crutches.  Final Clinical Impressions(s) / ED Diagnoses   Final diagnoses:  Low back pain  Lumbar radiculopathy    New Prescriptions New Prescriptions   CYCLOBENZAPRINE (FLEXERIL) 10 MG TABLET    Take 1 tablet (10 mg total) by mouth 2 (two) times daily as needed for muscle spasms.   ONDANSETRON (ZOFRAN ODT) 4 MG DISINTEGRATING TABLET     Take 1 tablet (4 mg total) by mouth every 8 (eight) hours as needed for nausea or vomiting.   OXYCODONE-ACETAMINOPHEN (PERCOCET) 5-325 MG TABLET    Take 1 tablet by mouth every 4 (four) hours as needed.   PREDNISONE (DELTASONE) 20 MG TABLET    Take 2 tablets (40 mg total) by mouth daily. Take 40 mg by mouth daily for 3 days, then 20mg  by mouth daily for 3 days, then 10mg  daily for 3 days     Delsa Grana, PA-C 11/25/15 0138    Pattricia Boss, MD 11/27/15 [phone]

## 2015-11-25 MED ORDER — OXYCODONE-ACETAMINOPHEN 5-325 MG PO TABS: 1.0000 | ORAL_TABLET | ORAL | 0 refills | Status: DC | PRN

## 2015-11-25 MED ORDER — OXYCODONE-ACETAMINOPHEN 5-325 MG PO TABS
1.0000 | ORAL_TABLET | Freq: Once | ORAL | Status: AC
  Administered 2015-11-25: 1 via ORAL
  Filled 2015-11-25: qty 1

## 2015-11-25 MED ORDER — CYCLOBENZAPRINE HCL 10 MG PO TABS: 10.0000 mg | ORAL_TABLET | Freq: Two times a day (BID) | ORAL | 0 refills | Status: DC | PRN

## 2015-11-25 MED ORDER — ONDANSETRON 4 MG PO TBDP: 4.0000 mg | ORAL_TABLET | Freq: Three times a day (TID) | ORAL | 0 refills | Status: DC | PRN

## 2015-11-25 MED ORDER — PREDNISONE 20 MG PO TABS: 40.0000 mg | ORAL_TABLET | Freq: Every day | ORAL | 0 refills | Status: DC

## 2015-11-25 MED ORDER — ONDANSETRON HCL 4 MG/2ML IJ SOLN: 4.0000 mg | Freq: Once | INTRAMUSCULAR | Status: DC | PRN

## 2015-11-25 MED ORDER — CYCLOBENZAPRINE HCL 10 MG PO TABS
10.0000 mg | ORAL_TABLET | Freq: Once | ORAL | Status: AC
  Administered 2015-11-25: 10 mg via ORAL
  Filled 2015-11-25: qty 1

## 2015-11-25 NOTE — ED Notes (Signed)
repaged Neurosurgery Christella Noa) to [facility] @[phone] 

## 2015-12-01 ENCOUNTER — Other Ambulatory Visit: Payer: Self-pay | Admitting: Neurosurgery

## 2015-12-01 DIAGNOSIS — M5416 Radiculopathy, lumbar region: Secondary | ICD-10-CM

## 2015-12-04 ENCOUNTER — Ambulatory Visit (INDEPENDENT_AMBULATORY_CARE_PROVIDER_SITE_OTHER): Payer: BLUE CROSS/BLUE SHIELD | Admitting: Family Medicine

## 2015-12-04 VITALS — BP 120/88 | HR 70 | Temp 98.0°F | Resp 16 | Ht 70.5 in | Wt 182.8 lb

## 2015-12-04 DIAGNOSIS — Z23 Encounter for immunization: Secondary | ICD-10-CM | POA: Diagnosis not present

## 2015-12-04 DIAGNOSIS — M5441 Lumbago with sciatica, right side: Secondary | ICD-10-CM

## 2015-12-04 NOTE — Patient Instructions (Addendum)
   IF you received an x-ray today, you will receive an invoice from [facility]. Please contact [facility] at [phone] with questions or concerns regarding your invoice.   IF you received labwork today, you will receive an invoice from [facility]. Please contact [facility] at [phone] with questions or concerns regarding your invoice.   Our billing staff will not be able to assist you with questions regarding bills from these companies.  You will be contacted with the lab results as soon as they are available. The fastest way to get your results is to activate your My Chart account. Instructions are located on the last page of this paperwork. If you have not heard from us regarding the results in 2 weeks, please contact this office.    Influenza (Flu) Vaccine (Inactivated or Recombinant):  1. Why get vaccinated? Influenza ("flu") is a contagious disease that spreads around the [place] every year, usually between October and May. Flu is caused by influenza viruses, and is spread mainly by coughing, sneezing, and close contact. Anyone can get flu. Flu strikes suddenly and can last several days. Symptoms vary by age, but can include:  fever/chills  sore throat  muscle aches  fatigue  cough  headache  runny or stuffy nose Flu can also lead to pneumonia and blood infections, and cause diarrhea and seizures in children. If you have a medical condition, such as heart or lung disease, flu can make it worse. Flu is more dangerous for some people. Infants and young children, people 65 years of age and older, pregnant women, and people with certain health conditions or a weakened immune system are at greatest risk. Each year thousands of people in the [place] die from flu, and many more are hospitalized. Flu vaccine can:  keep you from getting flu,  make flu less severe if you do get it, and  keep you from spreading flu to  your family and other people. 2. Inactivated and recombinant flu vaccines A dose of flu vaccine is recommended every flu season. Children 6 months through 8 years of age may need two doses during the same flu season. Everyone else needs only one dose each flu season. Some inactivated flu vaccines contain a very small amount of a mercury-based preservative called thimerosal. Studies have not shown thimerosal in vaccines to be harmful, but flu vaccines that do not contain thimerosal are available. There is no live flu virus in flu shots. They cannot cause the flu. There are many flu viruses, and they are always changing. Each year a new flu vaccine is made to protect against three or four viruses that are likely to cause disease in the upcoming flu season. But even when the vaccine doesn't exactly match these viruses, it may still provide some protection. Flu vaccine cannot prevent:  flu that is caused by a virus not covered by the vaccine, or  illnesses that look like flu but are not. It takes about 2 weeks for protection to develop after vaccination, and protection lasts through the flu season. 3. Some people should not get this vaccine Tell the person who is giving you the vaccine:  If you have any severe, life-threatening allergies. If you ever had a life-threatening allergic reaction after a dose of flu vaccine, or have a severe allergy to any part of this vaccine, you may be advised not to get vaccinated. Most, but not all, types of flu vaccine contain a small amount of egg protein.    If you ever had Guillain-Barre Syndrome (also called GBS). Some people with a history of GBS should not get this vaccine. This should be discussed with your doctor.  If you are not feeling well. It is usually okay to get flu vaccine when you have a mild illness, but you might be asked to come back when you feel better. 4. Risks of a vaccine reaction With any medicine, including vaccines, there is a chance of  reactions. These are usually mild and go away on their own, but serious reactions are also possible. Most people who get a flu shot do not have any problems with it. Minor problems following a flu shot include:  soreness, redness, or swelling where the shot was given  hoarseness  sore, red or itchy eyes  cough  fever  aches  headache  itching  fatigue If these problems occur, they usually begin soon after the shot and last 1 or 2 days. More serious problems following a flu shot can include the following:  There may be a small increased risk of Guillain-Barre Syndrome (GBS) after inactivated flu vaccine. This risk has been estimated at 1 or 2 additional cases per million people vaccinated. This is much lower than the risk of severe complications from flu, which can be prevented by flu vaccine.  Young children who get the flu shot along with pneumococcal vaccine (PCV13) and/or DTaP vaccine at the same time might be slightly more likely to have a seizure caused by fever. Ask your doctor for more information. Tell your doctor if a child who is getting flu vaccine has ever had a seizure. Problems that could happen after any injected vaccine:  People sometimes faint after a medical procedure, including vaccination. Sitting or lying down for about 15 minutes can help prevent fainting, and injuries caused by a fall. Tell your doctor if you feel dizzy, or have vision changes or ringing in the ears.  Some people get severe pain in the shoulder and have difficulty moving the arm where a shot was given. This happens very rarely.  Any medication can cause a severe allergic reaction. Such reactions from a vaccine are very rare, estimated at about 1 in a million doses, and would happen within a few minutes to a few hours after the vaccination. As with any medicine, there is a very remote chance of a vaccine causing a serious injury or death. The safety of vaccines is always being monitored. For  more information, visit: www.cdc.gov/vaccinesafety/ 5. What if there is a serious reaction? What should I look for?  Look for anything that concerns you, such as signs of a severe allergic reaction, very high fever, or unusual behavior. Signs of a severe allergic reaction can include hives, swelling of the face and throat, difficulty breathing, a fast heartbeat, dizziness, and weakness. These would start a few minutes to a few hours after the vaccination. What should I do?  If you think it is a severe allergic reaction or other emergency that can't wait, call 9-1-1 and get the person to the nearest hospital. Otherwise, call your doctor.  Reactions should be reported to the Vaccine Adverse Event Reporting System (VAERS). Your doctor should file this report, or you can do it yourself through the VAERS web site at www.vaers.hhs.gov, or by calling 1-800-822-7967. VAERS does not give medical advice. 6. The National Vaccine Injury Compensation Program The National Vaccine Injury Compensation Program (VICP) is a federal program that was created to compensate people who may have been   injured by certain vaccines. Persons who believe they may have been injured by a vaccine can learn about the program and about filing a claim by calling 1-800-338-2382 or visiting the VICP website at www.hrsa.gov/vaccinecompensation. There is a time limit to file a claim for compensation. 7. How can I learn more?  Ask your healthcare provider. He or she can give you the vaccine package insert or suggest other sources of information.  Call your local or state health department.  Contact the Centers for Disease Control and Prevention (CDC):  Call 1-800-232-4636 (1-800-CDC-INFO) or  Visit CDC's website at www.cdc.gov/flu Vaccine Information Statement Inactivated Influenza Vaccine (09/28/2013)   This information is not intended to replace advice given to you by your health care provider. Make sure you discuss any  questions you have with your health care provider.   Document Released: 12/03/2005 Document Revised: 03/01/2014 Document Reviewed: 10/01/2013 Elsevier Interactive Patient Education 2016 [employer]  
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
## 2015-12-14 ENCOUNTER — Encounter: Payer: Self-pay | Admitting: Family Medicine

## 2016-02-11 DIAGNOSIS — M48062 Spinal stenosis, lumbar region with neurogenic claudication: Secondary | ICD-10-CM | POA: Insufficient documentation

## 2016-08-06 ENCOUNTER — Other Ambulatory Visit: Payer: Self-pay | Admitting: Orthopedic Surgery

## 2016-08-06 DIAGNOSIS — M48062 Spinal stenosis, lumbar region with neurogenic claudication: Secondary | ICD-10-CM

## 2016-08-09 ENCOUNTER — Ambulatory Visit
Admission: RE | Admit: 2016-08-09 | Discharge: 2016-08-09 | Disposition: A | Discharge status: Home / Self Care | Payer: BLUE CROSS/BLUE SHIELD | Source: Ambulatory Visit | Attending: Orthopedic Surgery | Admitting: Orthopedic Surgery

## 2016-08-09 DIAGNOSIS — M48062 Spinal stenosis, lumbar region with neurogenic claudication: Secondary | ICD-10-CM

## 2016-11-23 DIAGNOSIS — E663 Overweight: Secondary | ICD-10-CM | POA: Insufficient documentation

## 2016-11-23 DIAGNOSIS — Z79899 Other long term (current) drug therapy: Secondary | ICD-10-CM | POA: Insufficient documentation

## 2016-12-21 ENCOUNTER — Other Ambulatory Visit: Payer: Self-pay | Admitting: Orthopedic Surgery

## 2016-12-21 DIAGNOSIS — M48061 Spinal stenosis, lumbar region without neurogenic claudication: Secondary | ICD-10-CM

## 2016-12-24 ENCOUNTER — Ambulatory Visit
Admission: RE | Admit: 2016-12-24 | Discharge: 2016-12-24 | Disposition: A | Discharge status: Home / Self Care | Payer: BLUE CROSS/BLUE SHIELD | Source: Ambulatory Visit | Attending: Orthopedic Surgery | Admitting: Orthopedic Surgery

## 2016-12-24 DIAGNOSIS — F1120 Opioid dependence, uncomplicated: Secondary | ICD-10-CM | POA: Diagnosis present

## 2016-12-24 DIAGNOSIS — M48061 Spinal stenosis, lumbar region without neurogenic claudication: Secondary | ICD-10-CM

## 2017-03-01 ENCOUNTER — Other Ambulatory Visit: Payer: Self-pay | Admitting: Orthopedic Surgery

## 2017-03-01 DIAGNOSIS — M48062 Spinal stenosis, lumbar region with neurogenic claudication: Secondary | ICD-10-CM

## 2017-03-03 ENCOUNTER — Other Ambulatory Visit: Payer: Self-pay | Admitting: Orthopedic Surgery

## 2017-03-03 DIAGNOSIS — M48062 Spinal stenosis, lumbar region with neurogenic claudication: Secondary | ICD-10-CM

## 2017-03-07 ENCOUNTER — Ambulatory Visit
Admission: RE | Admit: 2017-03-07 | Discharge: 2017-03-07 | Disposition: A | Discharge status: Home / Self Care | Payer: BLUE CROSS/BLUE SHIELD | Source: Ambulatory Visit | Attending: Orthopedic Surgery | Admitting: Orthopedic Surgery

## 2017-03-07 DIAGNOSIS — M48062 Spinal stenosis, lumbar region with neurogenic claudication: Secondary | ICD-10-CM

## 2017-07-14 ENCOUNTER — Encounter: Payer: Self-pay | Admitting: Family Medicine

## 2019-07-13 ENCOUNTER — Other Ambulatory Visit: Payer: Self-pay | Admitting: Physician Assistant

## 2019-07-13 DIAGNOSIS — M4807 Spinal stenosis, lumbosacral region: Secondary | ICD-10-CM

## 2019-07-13 DIAGNOSIS — M9972 Connective tissue and disc stenosis of intervertebral foramina of thoracic region: Secondary | ICD-10-CM

## 2019-07-13 DIAGNOSIS — M48062 Spinal stenosis, lumbar region with neurogenic claudication: Secondary | ICD-10-CM

## 2019-07-29 ENCOUNTER — Ambulatory Visit
Admission: RE | Admit: 2019-07-29 | Discharge: 2019-07-29 | Disposition: A | Discharge status: Home / Self Care | Payer: BC Managed Care – PPO | Source: Ambulatory Visit | Attending: Physician Assistant | Admitting: Physician Assistant

## 2019-07-29 DIAGNOSIS — M9972 Connective tissue and disc stenosis of intervertebral foramina of thoracic region: Secondary | ICD-10-CM

## 2019-07-29 DIAGNOSIS — M4807 Spinal stenosis, lumbosacral region: Secondary | ICD-10-CM

## 2019-07-29 DIAGNOSIS — M48062 Spinal stenosis, lumbar region with neurogenic claudication: Secondary | ICD-10-CM

## 2019-08-13 ENCOUNTER — Other Ambulatory Visit: Payer: BLUE CROSS/BLUE SHIELD

## 2019-10-02 DIAGNOSIS — M542 Cervicalgia: Secondary | ICD-10-CM | POA: Diagnosis not present

## 2019-10-02 DIAGNOSIS — M546 Pain in thoracic spine: Secondary | ICD-10-CM | POA: Diagnosis not present

## 2019-10-02 DIAGNOSIS — Z79899 Other long term (current) drug therapy: Secondary | ICD-10-CM | POA: Diagnosis not present

## 2019-10-02 DIAGNOSIS — F17210 Nicotine dependence, cigarettes, uncomplicated: Secondary | ICD-10-CM | POA: Diagnosis not present

## 2019-10-02 DIAGNOSIS — M62830 Muscle spasm of back: Secondary | ICD-10-CM | POA: Diagnosis not present

## 2019-10-02 DIAGNOSIS — M545 Low back pain: Secondary | ICD-10-CM | POA: Diagnosis not present

## 2019-11-01 DIAGNOSIS — M542 Cervicalgia: Secondary | ICD-10-CM | POA: Diagnosis not present

## 2019-11-01 DIAGNOSIS — M62830 Muscle spasm of back: Secondary | ICD-10-CM | POA: Diagnosis not present

## 2019-11-01 DIAGNOSIS — Z79899 Other long term (current) drug therapy: Secondary | ICD-10-CM | POA: Diagnosis not present

## 2019-11-01 DIAGNOSIS — F17210 Nicotine dependence, cigarettes, uncomplicated: Secondary | ICD-10-CM | POA: Diagnosis not present

## 2019-11-01 DIAGNOSIS — M546 Pain in thoracic spine: Secondary | ICD-10-CM | POA: Diagnosis not present

## 2019-11-01 DIAGNOSIS — M545 Low back pain: Secondary | ICD-10-CM | POA: Diagnosis not present

## 2019-11-08 DIAGNOSIS — M25561 Pain in right knee: Secondary | ICD-10-CM | POA: Diagnosis not present

## 2019-11-08 DIAGNOSIS — M79604 Pain in right leg: Secondary | ICD-10-CM | POA: Diagnosis not present

## 2019-11-08 DIAGNOSIS — F17210 Nicotine dependence, cigarettes, uncomplicated: Secondary | ICD-10-CM | POA: Diagnosis not present

## 2019-11-08 DIAGNOSIS — M79605 Pain in left leg: Secondary | ICD-10-CM | POA: Diagnosis not present

## 2019-11-14 DIAGNOSIS — M542 Cervicalgia: Secondary | ICD-10-CM | POA: Diagnosis not present

## 2019-11-14 DIAGNOSIS — M546 Pain in thoracic spine: Secondary | ICD-10-CM | POA: Diagnosis not present

## 2019-11-14 DIAGNOSIS — Z79899 Other long term (current) drug therapy: Secondary | ICD-10-CM | POA: Diagnosis not present

## 2019-11-14 DIAGNOSIS — M5416 Radiculopathy, lumbar region: Secondary | ICD-10-CM | POA: Diagnosis not present

## 2019-11-14 DIAGNOSIS — F17210 Nicotine dependence, cigarettes, uncomplicated: Secondary | ICD-10-CM | POA: Diagnosis not present

## 2019-11-28 DIAGNOSIS — M542 Cervicalgia: Secondary | ICD-10-CM | POA: Diagnosis not present

## 2019-11-28 DIAGNOSIS — M62830 Muscle spasm of back: Secondary | ICD-10-CM | POA: Diagnosis not present

## 2019-11-28 DIAGNOSIS — Z79899 Other long term (current) drug therapy: Secondary | ICD-10-CM | POA: Diagnosis not present

## 2019-11-28 DIAGNOSIS — M5416 Radiculopathy, lumbar region: Secondary | ICD-10-CM | POA: Diagnosis not present

## 2019-11-28 DIAGNOSIS — M546 Pain in thoracic spine: Secondary | ICD-10-CM | POA: Diagnosis not present

## 2019-12-05 DIAGNOSIS — M47814 Spondylosis without myelopathy or radiculopathy, thoracic region: Secondary | ICD-10-CM | POA: Diagnosis not present

## 2019-12-05 DIAGNOSIS — Z981 Arthrodesis status: Secondary | ICD-10-CM | POA: Diagnosis not present

## 2019-12-05 DIAGNOSIS — M9972 Connective tissue and disc stenosis of intervertebral foramina of thoracic region: Secondary | ICD-10-CM | POA: Diagnosis not present

## 2019-12-05 DIAGNOSIS — M4327 Fusion of spine, lumbosacral region: Secondary | ICD-10-CM | POA: Diagnosis not present

## 2019-12-05 DIAGNOSIS — M546 Pain in thoracic spine: Secondary | ICD-10-CM | POA: Diagnosis not present

## 2019-12-05 DIAGNOSIS — M48062 Spinal stenosis, lumbar region with neurogenic claudication: Secondary | ICD-10-CM | POA: Diagnosis not present

## 2020-01-14 DIAGNOSIS — G8929 Other chronic pain: Secondary | ICD-10-CM | POA: Diagnosis not present

## 2020-04-07 DIAGNOSIS — G8929 Other chronic pain: Secondary | ICD-10-CM | POA: Diagnosis not present

## 2020-07-01 DIAGNOSIS — G8929 Other chronic pain: Secondary | ICD-10-CM | POA: Diagnosis not present

## 2020-07-09 ENCOUNTER — Emergency Department (HOSPITAL_COMMUNITY): Payer: BC Managed Care – PPO

## 2020-07-09 ENCOUNTER — Inpatient Hospital Stay (HOSPITAL_COMMUNITY)
Admission: EM | Admit: 2020-07-09 | Discharge: 2020-07-15 | DRG: 474 | Disposition: A | Discharge status: Home / Self Care | Payer: BC Managed Care – PPO | Attending: Orthopedic Surgery | Admitting: Orthopedic Surgery

## 2020-07-09 ENCOUNTER — Encounter (HOSPITAL_COMMUNITY)
Admission: EM | Disposition: A | Discharge status: Home / Self Care | Payer: Self-pay | Source: Home / Self Care | Attending: Orthopedic Surgery

## 2020-07-09 DIAGNOSIS — S92301B Fracture of unspecified metatarsal bone(s), right foot, initial encounter for open fracture: Secondary | ICD-10-CM

## 2020-07-09 DIAGNOSIS — S82891B Other fracture of right lower leg, initial encounter for open fracture type I or II: Secondary | ICD-10-CM

## 2020-07-09 DIAGNOSIS — S92901B Unspecified fracture of right foot, initial encounter for open fracture: Secondary | ICD-10-CM | POA: Diagnosis present

## 2020-07-09 DIAGNOSIS — F1190 Opioid use, unspecified, uncomplicated: Secondary | ICD-10-CM | POA: Diagnosis present

## 2020-07-09 DIAGNOSIS — T1490XA Injury, unspecified, initial encounter: Secondary | ICD-10-CM

## 2020-07-09 DIAGNOSIS — Z419 Encounter for procedure for purposes other than remedying health state, unspecified: Secondary | ICD-10-CM

## 2020-07-09 DIAGNOSIS — F17200 Nicotine dependence, unspecified, uncomplicated: Secondary | ICD-10-CM | POA: Diagnosis present

## 2020-07-09 DIAGNOSIS — G894 Chronic pain syndrome: Secondary | ICD-10-CM | POA: Diagnosis present

## 2020-07-09 DIAGNOSIS — S88119A Complete traumatic amputation at level between knee and ankle, unspecified lower leg, initial encounter: Secondary | ICD-10-CM

## 2020-07-09 DIAGNOSIS — S82201C Unspecified fracture of shaft of right tibia, initial encounter for open fracture type IIIA, IIIB, or IIIC: Secondary | ICD-10-CM | POA: Diagnosis present

## 2020-07-09 DIAGNOSIS — S060X0A Concussion without loss of consciousness, initial encounter: Secondary | ICD-10-CM | POA: Diagnosis not present

## 2020-07-09 DIAGNOSIS — S82391A Other fracture of lower end of right tibia, initial encounter for closed fracture: Secondary | ICD-10-CM | POA: Diagnosis not present

## 2020-07-09 DIAGNOSIS — Z89511 Acquired absence of right leg below knee: Secondary | ICD-10-CM | POA: Diagnosis not present

## 2020-07-09 DIAGNOSIS — S3991XA Unspecified injury of abdomen, initial encounter: Secondary | ICD-10-CM | POA: Diagnosis not present

## 2020-07-09 DIAGNOSIS — Z20822 Contact with and (suspected) exposure to covid-19: Secondary | ICD-10-CM | POA: Diagnosis present

## 2020-07-09 DIAGNOSIS — S92331A Displaced fracture of third metatarsal bone, right foot, initial encounter for closed fracture: Secondary | ICD-10-CM | POA: Diagnosis not present

## 2020-07-09 DIAGNOSIS — S92321A Displaced fracture of second metatarsal bone, right foot, initial encounter for closed fracture: Secondary | ICD-10-CM | POA: Diagnosis not present

## 2020-07-09 DIAGNOSIS — S92251A Displaced fracture of navicular [scaphoid] of right foot, initial encounter for closed fracture: Secondary | ICD-10-CM | POA: Diagnosis not present

## 2020-07-09 DIAGNOSIS — S299XXA Unspecified injury of thorax, initial encounter: Secondary | ICD-10-CM | POA: Diagnosis not present

## 2020-07-09 DIAGNOSIS — M4326 Fusion of spine, lumbar region: Secondary | ICD-10-CM | POA: Diagnosis not present

## 2020-07-09 DIAGNOSIS — Y92410 Unspecified street and highway as the place of occurrence of the external cause: Secondary | ICD-10-CM

## 2020-07-09 DIAGNOSIS — S199XXA Unspecified injury of neck, initial encounter: Secondary | ICD-10-CM | POA: Diagnosis not present

## 2020-07-09 DIAGNOSIS — M549 Dorsalgia, unspecified: Secondary | ICD-10-CM | POA: Diagnosis not present

## 2020-07-09 DIAGNOSIS — M21961 Unspecified acquired deformity of right lower leg: Secondary | ICD-10-CM | POA: Diagnosis present

## 2020-07-09 DIAGNOSIS — E559 Vitamin D deficiency, unspecified: Secondary | ICD-10-CM

## 2020-07-09 DIAGNOSIS — S8981XA Other specified injuries of right lower leg, initial encounter: Secondary | ICD-10-CM | POA: Diagnosis not present

## 2020-07-09 DIAGNOSIS — S0990XA Unspecified injury of head, initial encounter: Secondary | ICD-10-CM | POA: Diagnosis not present

## 2020-07-09 DIAGNOSIS — S8261XA Displaced fracture of lateral malleolus of right fibula, initial encounter for closed fracture: Secondary | ICD-10-CM | POA: Diagnosis not present

## 2020-07-09 DIAGNOSIS — R000 Tachycardia, unspecified: Secondary | ICD-10-CM | POA: Diagnosis not present

## 2020-07-09 DIAGNOSIS — S82401B Unspecified fracture of shaft of right fibula, initial encounter for open fracture type I or II: Secondary | ICD-10-CM | POA: Diagnosis present

## 2020-07-09 DIAGNOSIS — S8251XA Displaced fracture of medial malleolus of right tibia, initial encounter for closed fracture: Secondary | ICD-10-CM | POA: Diagnosis not present

## 2020-07-09 DIAGNOSIS — S82401C Unspecified fracture of shaft of right fibula, initial encounter for open fracture type IIIA, IIIB, or IIIC: Secondary | ICD-10-CM | POA: Diagnosis present

## 2020-07-09 DIAGNOSIS — J320 Chronic maxillary sinusitis: Secondary | ICD-10-CM | POA: Diagnosis not present

## 2020-07-09 DIAGNOSIS — D62 Acute posthemorrhagic anemia: Secondary | ICD-10-CM | POA: Diagnosis not present

## 2020-07-09 DIAGNOSIS — M1711 Unilateral primary osteoarthritis, right knee: Secondary | ICD-10-CM | POA: Diagnosis not present

## 2020-07-09 DIAGNOSIS — Z9889 Other specified postprocedural states: Secondary | ICD-10-CM | POA: Diagnosis not present

## 2020-07-09 DIAGNOSIS — S82301A Unspecified fracture of lower end of right tibia, initial encounter for closed fracture: Secondary | ICD-10-CM | POA: Diagnosis not present

## 2020-07-09 DIAGNOSIS — Z79891 Long term (current) use of opiate analgesic: Secondary | ICD-10-CM

## 2020-07-09 DIAGNOSIS — Z981 Arthrodesis status: Secondary | ICD-10-CM

## 2020-07-09 DIAGNOSIS — S82871B Displaced pilon fracture of right tibia, initial encounter for open fracture type I or II: Principal | ICD-10-CM | POA: Diagnosis present

## 2020-07-09 DIAGNOSIS — S82831A Other fracture of upper and lower end of right fibula, initial encounter for closed fracture: Secondary | ICD-10-CM | POA: Diagnosis not present

## 2020-07-09 DIAGNOSIS — S92321B Displaced fracture of second metatarsal bone, right foot, initial encounter for open fracture: Secondary | ICD-10-CM | POA: Diagnosis not present

## 2020-07-09 DIAGNOSIS — S92311A Displaced fracture of first metatarsal bone, right foot, initial encounter for closed fracture: Secondary | ICD-10-CM | POA: Diagnosis not present

## 2020-07-09 DIAGNOSIS — G8918 Other acute postprocedural pain: Secondary | ICD-10-CM | POA: Diagnosis not present

## 2020-07-09 DIAGNOSIS — S99911A Unspecified injury of right ankle, initial encounter: Secondary | ICD-10-CM | POA: Diagnosis not present

## 2020-07-09 DIAGNOSIS — M66351 Spontaneous rupture of flexor tendons, right thigh: Secondary | ICD-10-CM | POA: Diagnosis not present

## 2020-07-09 DIAGNOSIS — F17210 Nicotine dependence, cigarettes, uncomplicated: Secondary | ICD-10-CM | POA: Diagnosis present

## 2020-07-09 DIAGNOSIS — M79672 Pain in left foot: Secondary | ICD-10-CM | POA: Diagnosis present

## 2020-07-09 DIAGNOSIS — S92351A Displaced fracture of fifth metatarsal bone, right foot, initial encounter for closed fracture: Secondary | ICD-10-CM | POA: Diagnosis not present

## 2020-07-09 DIAGNOSIS — Z23 Encounter for immunization: Secondary | ICD-10-CM | POA: Diagnosis not present

## 2020-07-09 DIAGNOSIS — F1120 Opioid dependence, uncomplicated: Secondary | ICD-10-CM | POA: Diagnosis present

## 2020-07-09 DIAGNOSIS — S82251B Displaced comminuted fracture of shaft of right tibia, initial encounter for open fracture type I or II: Secondary | ICD-10-CM | POA: Diagnosis not present

## 2020-07-09 DIAGNOSIS — F17209 Nicotine dependence, unspecified, with unspecified nicotine-induced disorders: Secondary | ICD-10-CM | POA: Diagnosis not present

## 2020-07-09 DIAGNOSIS — I700 Atherosclerosis of aorta: Secondary | ICD-10-CM | POA: Diagnosis not present

## 2020-07-09 DIAGNOSIS — S82831C Other fracture of upper and lower end of right fibula, initial encounter for open fracture type IIIA, IIIB, or IIIC: Secondary | ICD-10-CM | POA: Diagnosis not present

## 2020-07-09 DIAGNOSIS — I10 Essential (primary) hypertension: Secondary | ICD-10-CM | POA: Diagnosis not present

## 2020-07-09 DIAGNOSIS — J432 Centrilobular emphysema: Secondary | ICD-10-CM | POA: Diagnosis not present

## 2020-07-09 DIAGNOSIS — S82871C Displaced pilon fracture of right tibia, initial encounter for open fracture type IIIA, IIIB, or IIIC: Secondary | ICD-10-CM | POA: Diagnosis not present

## 2020-07-09 DIAGNOSIS — I499 Cardiac arrhythmia, unspecified: Secondary | ICD-10-CM | POA: Diagnosis not present

## 2020-07-09 DIAGNOSIS — M7989 Other specified soft tissue disorders: Secondary | ICD-10-CM | POA: Diagnosis not present

## 2020-07-09 DIAGNOSIS — S92341A Displaced fracture of fourth metatarsal bone, right foot, initial encounter for closed fracture: Secondary | ICD-10-CM | POA: Diagnosis not present

## 2020-07-09 HISTORY — PX: EXTERNAL FIXATION LEG: SHX1549

## 2020-07-09 LAB — COMPREHENSIVE METABOLIC PANEL
ALT: 122 U/L — ABNORMAL HIGH (ref 0–44)
AST: 174 U/L — ABNORMAL HIGH (ref 15–41)
Albumin: 4 g/dL (ref 3.5–5.0)
Alkaline Phosphatase: 66 U/L (ref 38–126)
Anion gap: 9 (ref 5–15)
BUN: 18 mg/dL (ref 6–20)
CO2: 21 mmol/L — ABNORMAL LOW (ref 22–32)
Calcium: 9.2 mg/dL (ref 8.9–10.3)
Chloride: 108 mmol/L (ref 98–111)
Creatinine, Ser: 1.49 mg/dL — ABNORMAL HIGH (ref 0.61–1.24)
GFR, Estimated: 56 mL/min — ABNORMAL LOW (ref >60)
Glucose, Bld: 122 mg/dL — ABNORMAL HIGH (ref 70–99)
Potassium: 3.6 mmol/L (ref 3.5–5.1)
Sodium: 138 mmol/L (ref 135–145)
Total Bilirubin: 0.4 mg/dL (ref 0.3–1.2)
Total Protein: 6.7 g/dL (ref 6.5–8.1)

## 2020-07-09 LAB — CBC
HCT: 45.1 % (ref 39.0–52.0)
Hemoglobin: 14.6 g/dL (ref 13.0–17.0)
MCH: 30.1 pg (ref 26.0–34.0)
MCHC: 32.4 g/dL (ref 30.0–36.0)
MCV: 93 fL (ref 80.0–100.0)
Platelets: 275 10*3/uL (ref 150–400)
RBC: 4.85 MIL/uL (ref 4.22–5.81)
RDW: 13.5 % (ref 11.5–15.5)
WBC: 19.2 10*3/uL — ABNORMAL HIGH (ref 4.0–10.5)
nRBC: 0.1 % (ref 0.0–0.2)

## 2020-07-09 LAB — I-STAT CHEM 8, ED
BUN: 22 mg/dL — ABNORMAL HIGH (ref 6–20)
Calcium, Ion: 1.11 mmol/L — ABNORMAL LOW (ref 1.15–1.40)
Chloride: 107 mmol/L (ref 98–111)
Creatinine, Ser: 1.4 mg/dL — ABNORMAL HIGH (ref 0.61–1.24)
Glucose, Bld: 115 mg/dL — ABNORMAL HIGH (ref 70–99)
HCT: 44 % (ref 39.0–52.0)
Hemoglobin: 15 g/dL (ref 13.0–17.0)
Potassium: 3.5 mmol/L (ref 3.5–5.1)
Sodium: 141 mmol/L (ref 135–145)
TCO2: 23 mmol/L (ref 22–32)

## 2020-07-09 LAB — SAMPLE TO BLOOD BANK

## 2020-07-09 LAB — PROTIME-INR
INR: 0.9 (ref 0.8–1.2)
Prothrombin Time: 12.5 seconds (ref 11.4–15.2)

## 2020-07-09 LAB — ETHANOL: Alcohol, Ethyl (B): <10 mg/dL (ref <10)

## 2020-07-09 SURGERY — EXTERNAL FIXATION, LOWER EXTREMITY
Anesthesia: General | Site: Leg Lower | Laterality: Right

## 2020-07-09 MED ORDER — TETANUS-DIPHTH-ACELL PERTUSSIS 5-2.5-18.5 LF-MCG/0.5 IM SUSY
0.5000 mL | PREFILLED_SYRINGE | Freq: Once | INTRAMUSCULAR | Status: AC
  Administered 2020-07-09: 0.5 mL via INTRAMUSCULAR
  Filled 2020-07-09: qty 0.5

## 2020-07-09 MED ORDER — FENTANYL CITRATE (PF) 250 MCG/5ML IJ SOLN
INTRAMUSCULAR | Status: AC
  Filled 2020-07-09: qty 5

## 2020-07-09 MED ORDER — MIDAZOLAM HCL 2 MG/2ML IJ SOLN
INTRAMUSCULAR | Status: AC
  Filled 2020-07-09: qty 2

## 2020-07-09 MED ORDER — HYDROMORPHONE HCL 1 MG/ML IJ SOLN
1.0000 mg | Freq: Once | INTRAMUSCULAR | Status: AC
Start: 2020-07-09 — End: 2020-07-09
  Administered 2020-07-09: 1 mg via INTRAVENOUS
  Filled 2020-07-09: qty 1

## 2020-07-09 MED ORDER — PROPOFOL 10 MG/ML IV BOLUS
INTRAVENOUS | Status: AC
  Filled 2020-07-09: qty 20

## 2020-07-09 MED ORDER — HYDROMORPHONE HCL 1 MG/ML IJ SOLN
1.0000 mg | Freq: Once | INTRAMUSCULAR | Status: AC
  Administered 2020-07-09: 1 mg via INTRAVENOUS
  Filled 2020-07-09: qty 1

## 2020-07-09 MED ORDER — IOHEXOL 350 MG/ML SOLN
100.0000 mL | Freq: Once | INTRAVENOUS | Status: AC | PRN
  Administered 2020-07-09: 100 mL via INTRAVENOUS

## 2020-07-09 SURGICAL SUPPLY — 51 items
BANDAGE ESMARK 6X9 LF (GAUZE/BANDAGES/DRESSINGS) ×1 IMPLANT
BNDG COHESIVE 6X5 TAN STRL LF (GAUZE/BANDAGES/DRESSINGS) IMPLANT
BNDG ELASTIC 3X5.8 VLCR STR LF (GAUZE/BANDAGES/DRESSINGS) ×2 IMPLANT
BNDG ELASTIC 4X5.8 VLCR STR LF (GAUZE/BANDAGES/DRESSINGS) ×2 IMPLANT
BNDG ELASTIC 6X5.8 VLCR STR LF (GAUZE/BANDAGES/DRESSINGS) ×2 IMPLANT
BNDG ESMARK 6X9 LF (GAUZE/BANDAGES/DRESSINGS) ×2
BNDG GAUZE ELAST 4 BULKY (GAUZE/BANDAGES/DRESSINGS) ×4 IMPLANT
BRUSH SCRUB EZ PLAIN DRY (MISCELLANEOUS) ×4 IMPLANT
CAP EXTERNAL F/PIN ×4 IMPLANT
COVER SURGICAL LIGHT HANDLE (MISCELLANEOUS) ×4 IMPLANT
COVER WAND RF STERILE (DRAPES) ×2 IMPLANT
DRAPE C-ARM 42X72 X-RAY (DRAPES) ×2 IMPLANT
DRAPE C-ARMOR (DRAPES) ×2 IMPLANT
DRAPE U-SHAPE 47X51 STRL (DRAPES) ×2 IMPLANT
ELECT REM PT RETURN 9FT ADLT (ELECTROSURGICAL) ×2
ELECTRODE REM PT RTRN 9FT ADLT (ELECTROSURGICAL) ×1 IMPLANT
GAUZE SPONGE 4X4 12PLY STRL (GAUZE/BANDAGES/DRESSINGS) ×2 IMPLANT
GAUZE XEROFORM 5X9 LF (GAUZE/BANDAGES/DRESSINGS) ×2 IMPLANT
GLOVE BIO SURGEON STRL SZ7.5 (GLOVE) ×2 IMPLANT
GLOVE BIO SURGEON STRL SZ8 (GLOVE) ×2 IMPLANT
GLOVE BIOGEL PI IND STRL 7.5 (GLOVE) ×1 IMPLANT
GLOVE BIOGEL PI INDICATOR 7.5 (GLOVE) ×1
GLOVE SRG 8 PF TXTR STRL LF DI (GLOVE) ×2 IMPLANT
GLOVE SURG SYN 7.5  E (GLOVE) ×1
GLOVE SURG SYN 7.5 E (GLOVE) ×1 IMPLANT
GLOVE SURG UNDER POLY LF SZ8 (GLOVE) ×2
GOWN STRL REUS W/ TWL LRG LVL3 (GOWN DISPOSABLE) ×2 IMPLANT
GOWN STRL REUS W/ TWL XL LVL3 (GOWN DISPOSABLE) ×2 IMPLANT
GOWN STRL REUS W/TWL LRG LVL3 (GOWN DISPOSABLE) ×2
GOWN STRL REUS W/TWL XL LVL3 (GOWN DISPOSABLE) ×2
KIT BASIN OR (CUSTOM PROCEDURE TRAY) ×2 IMPLANT
KIT TURNOVER KIT B (KITS) ×2 IMPLANT
MANIFOLD NEPTUNE II (INSTRUMENTS) ×2 IMPLANT
NS IRRIG 1000ML POUR BTL (IV SOLUTION) ×2 IMPLANT
PACK ORTHO EXTREMITY (CUSTOM PROCEDURE TRAY) ×2 IMPLANT
PAD ARMBOARD 7.5X6 YLW CONV (MISCELLANEOUS) ×4 IMPLANT
PAD CAST 4YDX4 CTTN HI CHSV (CAST SUPPLIES) ×1 IMPLANT
PADDING CAST COTTON 4X4 STRL (CAST SUPPLIES) ×1
PADDING CAST COTTON 6X4 STRL (CAST SUPPLIES) ×4 IMPLANT
PIN APEX 5X150 (EXFIX) ×4 IMPLANT
PIN TO ROD COUPLING EXFIX (EXFIX) ×8 IMPLANT
PIN TRANSFIX 615X300 EXFIX (EXFIX) ×4 IMPLANT
ROD CARBON (EXFIX) ×1
ROD CNCT 400X11XNS LF HFMN (EXFIX) ×1 IMPLANT
ROD HOFFMANN3 CONNECT 11X150 (EXFIX) ×4 IMPLANT
ROD HOFFMANN3 CONNECT 11X350 (EXFIX) ×2 IMPLANT
ROD TO ROD COUPLING EXFIX (EXFIX) ×8 IMPLANT
SPONGE LAP 18X18 RF (DISPOSABLE) IMPLANT
STOCKINETTE IMPERVIOUS LG (DRAPES) IMPLANT
TOWEL GREEN STERILE (TOWEL DISPOSABLE) ×4 IMPLANT
UNDERPAD 30X36 HEAVY ABSORB (UNDERPADS AND DIAPERS) ×2 IMPLANT
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
## 2020-07-09 NOTE — ED Notes (Addendum)
Patient transported to CT 

## 2020-07-09 NOTE — ED Notes (Signed)
Patient transported to MRI 

## 2020-07-09 NOTE — ED Notes (Addendum)
Pt denies recreational drug use, reports that he is on a pain management regimen for chronic back pain taking 30 mg Oxycodone daily. Repetitive questioning and slurring noted.

## 2020-07-09 NOTE — ED Notes (Signed)
Condom catheter placed on patient.
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
## 2020-07-09 NOTE — ED Notes (Signed)
Manual blood pressure on arrival to room 138/82

## 2020-07-10 ENCOUNTER — Emergency Department (HOSPITAL_COMMUNITY): Payer: BC Managed Care – PPO | Admitting: Certified Registered Nurse Anesthetist

## 2020-07-10 ENCOUNTER — Inpatient Hospital Stay (HOSPITAL_COMMUNITY): Payer: BC Managed Care – PPO

## 2020-07-10 ENCOUNTER — Encounter (INDEPENDENT_AMBULATORY_CARE_PROVIDER_SITE_OTHER): Payer: Self-pay | Admitting: Physician Assistant

## 2020-07-10 ENCOUNTER — Encounter (HOSPITAL_COMMUNITY)
Admission: EM | Disposition: A | Discharge status: Home / Self Care | Payer: Self-pay | Source: Home / Self Care | Attending: Orthopedic Surgery

## 2020-07-10 ENCOUNTER — Encounter (HOSPITAL_COMMUNITY): Payer: Self-pay | Admitting: Orthopedic Surgery

## 2020-07-10 ENCOUNTER — Inpatient Hospital Stay (HOSPITAL_COMMUNITY): Payer: BC Managed Care – PPO | Admitting: Certified Registered Nurse Anesthetist

## 2020-07-10 DIAGNOSIS — S82201C Unspecified fracture of shaft of right tibia, initial encounter for open fracture type IIIA, IIIB, or IIIC: Secondary | ICD-10-CM | POA: Diagnosis present

## 2020-07-10 DIAGNOSIS — Z79891 Long term (current) use of opiate analgesic: Secondary | ICD-10-CM | POA: Diagnosis not present

## 2020-07-10 DIAGNOSIS — S82401B Unspecified fracture of shaft of right fibula, initial encounter for open fracture type I or II: Secondary | ICD-10-CM | POA: Diagnosis present

## 2020-07-10 DIAGNOSIS — I10 Essential (primary) hypertension: Secondary | ICD-10-CM | POA: Diagnosis not present

## 2020-07-10 DIAGNOSIS — M79672 Pain in left foot: Secondary | ICD-10-CM | POA: Diagnosis present

## 2020-07-10 DIAGNOSIS — S82891B Other fracture of right lower leg, initial encounter for open fracture type I or II: Secondary | ICD-10-CM | POA: Diagnosis present

## 2020-07-10 DIAGNOSIS — S82831C Other fracture of upper and lower end of right fibula, initial encounter for open fracture type IIIA, IIIB, or IIIC: Secondary | ICD-10-CM | POA: Diagnosis not present

## 2020-07-10 DIAGNOSIS — Y92410 Unspecified street and highway as the place of occurrence of the external cause: Secondary | ICD-10-CM | POA: Diagnosis not present

## 2020-07-10 DIAGNOSIS — F17210 Nicotine dependence, cigarettes, uncomplicated: Secondary | ICD-10-CM | POA: Diagnosis present

## 2020-07-10 DIAGNOSIS — S82871C Displaced pilon fracture of right tibia, initial encounter for open fracture type IIIA, IIIB, or IIIC: Secondary | ICD-10-CM | POA: Diagnosis not present

## 2020-07-10 DIAGNOSIS — S8981XA Other specified injuries of right lower leg, initial encounter: Secondary | ICD-10-CM | POA: Diagnosis not present

## 2020-07-10 DIAGNOSIS — S82871B Displaced pilon fracture of right tibia, initial encounter for open fracture type I or II: Secondary | ICD-10-CM | POA: Diagnosis present

## 2020-07-10 DIAGNOSIS — G894 Chronic pain syndrome: Secondary | ICD-10-CM | POA: Diagnosis present

## 2020-07-10 DIAGNOSIS — Z23 Encounter for immunization: Secondary | ICD-10-CM | POA: Diagnosis not present

## 2020-07-10 DIAGNOSIS — M549 Dorsalgia, unspecified: Secondary | ICD-10-CM | POA: Diagnosis present

## 2020-07-10 DIAGNOSIS — Z981 Arthrodesis status: Secondary | ICD-10-CM | POA: Diagnosis not present

## 2020-07-10 DIAGNOSIS — Z20822 Contact with and (suspected) exposure to covid-19: Secondary | ICD-10-CM | POA: Diagnosis present

## 2020-07-10 DIAGNOSIS — M21961 Unspecified acquired deformity of right lower leg: Secondary | ICD-10-CM | POA: Diagnosis present

## 2020-07-10 DIAGNOSIS — D62 Acute posthemorrhagic anemia: Secondary | ICD-10-CM | POA: Diagnosis not present

## 2020-07-10 HISTORY — PX: AMPUTATION: SHX166

## 2020-07-10 LAB — BASIC METABOLIC PANEL
Anion gap: 8 (ref 5–15)
BUN: 17 mg/dL (ref 6–20)
CO2: 20 mmol/L — ABNORMAL LOW (ref 22–32)
Calcium: 8.4 mg/dL — ABNORMAL LOW (ref 8.9–10.3)
Chloride: 108 mmol/L (ref 98–111)
Creatinine, Ser: 1.15 mg/dL (ref 0.61–1.24)
GFR, Estimated: >60 mL/min (ref >60)
Glucose, Bld: 193 mg/dL — ABNORMAL HIGH (ref 70–99)
Potassium: 4 mmol/L (ref 3.5–5.1)
Sodium: 136 mmol/L (ref 135–145)

## 2020-07-10 LAB — LACTIC ACID, PLASMA
Lactic Acid, Venous: 2.7 mmol/L (ref 0.5–1.9)
Lactic Acid, Venous: 1.6 mmol/L (ref 0.5–1.9)

## 2020-07-10 LAB — CBC
HCT: 33.8 % — ABNORMAL LOW (ref 39.0–52.0)
Hemoglobin: 11.4 g/dL — ABNORMAL LOW (ref 13.0–17.0)
MCH: 30 pg (ref 26.0–34.0)
MCHC: 33.7 g/dL (ref 30.0–36.0)
MCV: 88.9 fL (ref 80.0–100.0)
Platelets: 185 10*3/uL (ref 150–400)
RBC: 3.8 MIL/uL — ABNORMAL LOW (ref 4.22–5.81)
RDW: 13.7 % (ref 11.5–15.5)
WBC: 17.5 10*3/uL — ABNORMAL HIGH (ref 4.0–10.5)
nRBC: 0 % (ref 0.0–0.2)

## 2020-07-10 LAB — SURGICAL PCR SCREEN
MRSA, PCR: NEGATIVE
Staphylococcus aureus: NEGATIVE

## 2020-07-10 LAB — RESP PANEL BY RT-PCR (FLU A&B, COVID) ARPGX2
Influenza A by PCR: NEGATIVE
Influenza B by PCR: NEGATIVE
SARS Coronavirus 2 by RT PCR: NEGATIVE

## 2020-07-10 SURGERY — AMPUTATION BELOW KNEE
Anesthesia: General | Site: Ankle | Laterality: Right

## 2020-07-10 MED ORDER — HYDROMORPHONE HCL 1 MG/ML IJ SOLN: 0.5000 mg | INTRAMUSCULAR | Status: DC | PRN

## 2020-07-10 MED ORDER — SUGAMMADEX SODIUM 500 MG/5ML IV SOLN
INTRAVENOUS | Status: AC
  Filled 2020-07-10: qty 5

## 2020-07-10 MED ORDER — SUCCINYLCHOLINE CHLORIDE 20 MG/ML IJ SOLN
INTRAMUSCULAR | Status: DC | PRN
  Administered 2020-07-10: 120 mg via INTRAVENOUS

## 2020-07-10 MED ORDER — ALBUMIN HUMAN 5 % IV SOLN: INTRAVENOUS | Status: DC | PRN

## 2020-07-10 MED ORDER — PHENYLEPHRINE 40 MCG/ML (10ML) SYRINGE FOR IV PUSH (FOR BLOOD PRESSURE SUPPORT)
PREFILLED_SYRINGE | INTRAVENOUS | Status: AC
  Filled 2020-07-10: qty 10

## 2020-07-10 MED ORDER — HYDROMORPHONE HCL 1 MG/ML IJ SOLN
INTRAMUSCULAR | Status: AC
  Filled 2020-07-10: qty 1

## 2020-07-10 MED ORDER — DOCUSATE SODIUM 100 MG PO CAPS
100.0000 mg | ORAL_CAPSULE | Freq: Two times a day (BID) | ORAL | Status: DC
  Administered 2020-07-10 – 2020-07-14 (×9): 100 mg via ORAL
  Filled 2020-07-10 (×11): qty 1

## 2020-07-10 MED ORDER — BUPIVACAINE LIPOSOME 1.3 % IJ SUSP
INTRAMUSCULAR | Status: DC | PRN
  Administered 2020-07-10: 10 mL via PERINEURAL

## 2020-07-10 MED ORDER — SUCCINYLCHOLINE CHLORIDE 200 MG/10ML IV SOSY
PREFILLED_SYRINGE | INTRAVENOUS | Status: AC
  Filled 2020-07-10: qty 30

## 2020-07-10 MED ORDER — POTASSIUM CHLORIDE IN NACL 20-0.9 MEQ/L-% IV SOLN
INTRAVENOUS | Status: DC
  Filled 2020-07-10 (×2): qty 1000

## 2020-07-10 MED ORDER — METHOCARBAMOL 500 MG PO TABS
1000.0000 mg | ORAL_TABLET | Freq: Three times a day (TID) | ORAL | Status: DC
  Administered 2020-07-10 – 2020-07-15 (×13): 1000 mg via ORAL
  Filled 2020-07-10 (×14): qty 2

## 2020-07-10 MED ORDER — CEFAZOLIN SODIUM-DEXTROSE 2-4 GM/100ML-% IV SOLN
2.0000 g | Freq: Four times a day (QID) | INTRAVENOUS | Status: DC
  Administered 2020-07-10: 2 g via INTRAVENOUS
  Filled 2020-07-10 (×2): qty 100

## 2020-07-10 MED ORDER — MIDAZOLAM HCL 5 MG/5ML IJ SOLN
INTRAMUSCULAR | Status: DC | PRN
  Administered 2020-07-10: 2 mg via INTRAVENOUS

## 2020-07-10 MED ORDER — LIDOCAINE 2% (20 MG/ML) 5 ML SYRINGE
INTRAMUSCULAR | Status: AC
  Filled 2020-07-10: qty 10

## 2020-07-10 MED ORDER — OXYCODONE HCL 5 MG PO TABS: 5.0000 mg | ORAL_TABLET | Freq: Once | ORAL | Status: DC | PRN

## 2020-07-10 MED ORDER — ENOXAPARIN SODIUM 40 MG/0.4ML IJ SOSY
40.0000 mg | PREFILLED_SYRINGE | INTRAMUSCULAR | Status: DC
  Administered 2020-07-11: 40 mg via SUBCUTANEOUS
  Filled 2020-07-10: qty 0.4

## 2020-07-10 MED ORDER — FENTANYL CITRATE (PF) 100 MCG/2ML IJ SOLN
INTRAMUSCULAR | Status: AC
  Filled 2020-07-10: qty 2

## 2020-07-10 MED ORDER — ONDANSETRON HCL 4 MG/2ML IJ SOLN
INTRAMUSCULAR | Status: AC
  Filled 2020-07-10: qty 2

## 2020-07-10 MED ORDER — ACETAMINOPHEN 500 MG PO TABS
1000.0000 mg | ORAL_TABLET | Freq: Four times a day (QID) | ORAL | Status: DC
  Administered 2020-07-10 (×2): 1000 mg via ORAL
  Filled 2020-07-10 (×2): qty 2

## 2020-07-10 MED ORDER — PROPOFOL 10 MG/ML IV BOLUS
INTRAVENOUS | Status: DC | PRN
  Administered 2020-07-10: 150 mg via INTRAVENOUS

## 2020-07-10 MED ORDER — ONDANSETRON HCL 4 MG/2ML IJ SOLN: 4.0000 mg | Freq: Four times a day (QID) | INTRAMUSCULAR | Status: DC | PRN

## 2020-07-10 MED ORDER — DEXAMETHASONE SODIUM PHOSPHATE 10 MG/ML IJ SOLN
INTRAMUSCULAR | Status: DC | PRN
  Administered 2020-07-10: 10 mg via INTRAVENOUS

## 2020-07-10 MED ORDER — SUGAMMADEX SODIUM 200 MG/2ML IV SOLN
INTRAVENOUS | Status: DC | PRN
  Administered 2020-07-10: 200 mg via INTRAVENOUS

## 2020-07-10 MED ORDER — CEFAZOLIN SODIUM-DEXTROSE 2-4 GM/100ML-% IV SOLN
2.0000 g | INTRAVENOUS | Status: AC
  Administered 2020-07-10: 2 g via INTRAVENOUS
  Filled 2020-07-10: qty 100

## 2020-07-10 MED ORDER — LIDOCAINE 2% (20 MG/ML) 5 ML SYRINGE
INTRAMUSCULAR | Status: DC | PRN
  Administered 2020-07-10: 60 mg via INTRAVENOUS

## 2020-07-10 MED ORDER — BUPIVACAINE-EPINEPHRINE (PF) 0.5% -1:200000 IJ SOLN
INTRAMUSCULAR | Status: DC | PRN
  Administered 2020-07-10: 20 mL via PERINEURAL
  Administered 2020-07-10: 15 mL via PERINEURAL

## 2020-07-10 MED ORDER — PROPOFOL 10 MG/ML IV BOLUS
INTRAVENOUS | Status: AC
  Filled 2020-07-10: qty 40

## 2020-07-10 MED ORDER — OXYCODONE HCL 5 MG/5ML PO SOLN: 5.0000 mg | Freq: Once | ORAL | Status: DC | PRN

## 2020-07-10 MED ORDER — DOCUSATE SODIUM 100 MG PO CAPS: 100.0000 mg | ORAL_CAPSULE | Freq: Every day | ORAL | Status: DC

## 2020-07-10 MED ORDER — PHENOL 1.4 % MT LIQD: 1.0000 | OROMUCOSAL | Status: DC | PRN

## 2020-07-10 MED ORDER — PANTOPRAZOLE SODIUM 40 MG PO TBEC
40.0000 mg | DELAYED_RELEASE_TABLET | Freq: Every day | ORAL | Status: DC
Start: 2020-07-11 — End: 2020-07-10

## 2020-07-10 MED ORDER — FENTANYL 50 MCG/ML IV PCA SOLN
INTRAVENOUS | Status: DC
  Filled 2020-07-10: qty 20

## 2020-07-10 MED ORDER — ARTIFICIAL TEARS OPHTHALMIC OINT
TOPICAL_OINTMENT | OPHTHALMIC | Status: AC
  Filled 2020-07-10: qty 3.5

## 2020-07-10 MED ORDER — CEFAZOLIN SODIUM-DEXTROSE 2-3 GM-%(50ML) IV SOLR
INTRAVENOUS | Status: DC | PRN
  Administered 2020-07-10: 2 g via INTRAVENOUS

## 2020-07-10 MED ORDER — ASCORBIC ACID 500 MG PO TABS
1000.0000 mg | ORAL_TABLET | Freq: Every day | ORAL | Status: DC
Start: 2020-07-11 — End: 2020-07-15
  Administered 2020-07-11 – 2020-07-15 (×5): 1000 mg via ORAL
  Filled 2020-07-10 (×5): qty 2

## 2020-07-10 MED ORDER — SODIUM CHLORIDE 0.9 % IV SOLN
2.0000 g | INTRAVENOUS | Status: AC
  Administered 2020-07-10 – 2020-07-12 (×3): 2 g via INTRAVENOUS
  Filled 2020-07-10 (×3): qty 20

## 2020-07-10 MED ORDER — ROCURONIUM BROMIDE 10 MG/ML (PF) SYRINGE
PREFILLED_SYRINGE | INTRAVENOUS | Status: AC
  Filled 2020-07-10: qty 30

## 2020-07-10 MED ORDER — DIPHENHYDRAMINE HCL 12.5 MG/5ML PO ELIX: 12.5000 mg | ORAL_SOLUTION | ORAL | Status: DC | PRN

## 2020-07-10 MED ORDER — HYDROMORPHONE HCL 1 MG/ML IJ SOLN: 0.2500 mg | INTRAMUSCULAR | Status: DC | PRN

## 2020-07-10 MED ORDER — GUAIFENESIN-DM 100-10 MG/5ML PO SYRP: 15.0000 mL | ORAL_SOLUTION | ORAL | Status: DC | PRN

## 2020-07-10 MED ORDER — MIDAZOLAM HCL 2 MG/2ML IJ SOLN
INTRAMUSCULAR | Status: AC
  Filled 2020-07-10: qty 2

## 2020-07-10 MED ORDER — EPHEDRINE 5 MG/ML INJ
INTRAVENOUS | Status: AC
  Filled 2020-07-10: qty 20

## 2020-07-10 MED ORDER — DIPHENHYDRAMINE HCL 50 MG/ML IJ SOLN: 12.5000 mg | Freq: Four times a day (QID) | INTRAMUSCULAR | Status: DC | PRN

## 2020-07-10 MED ORDER — FENTANYL CITRATE (PF) 250 MCG/5ML IJ SOLN
INTRAMUSCULAR | Status: AC
  Filled 2020-07-10: qty 5

## 2020-07-10 MED ORDER — METHOCARBAMOL 1000 MG/10ML IJ SOLN
500.0000 mg | Freq: Four times a day (QID) | INTRAVENOUS | Status: DC | PRN
  Filled 2020-07-10 (×3): qty 5

## 2020-07-10 MED ORDER — POLYETHYLENE GLYCOL 3350 17 G PO PACK: 17.0000 g | PACK | Freq: Every day | ORAL | Status: DC | PRN

## 2020-07-10 MED ORDER — ONDANSETRON HCL 4 MG/2ML IJ SOLN
INTRAMUSCULAR | Status: DC | PRN
  Administered 2020-07-10: 4 mg via INTRAVENOUS

## 2020-07-10 MED ORDER — CHLORHEXIDINE GLUCONATE 4 % EX LIQD: 60.0000 mL | Freq: Once | CUTANEOUS | Status: DC

## 2020-07-10 MED ORDER — SUGAMMADEX SODIUM 200 MG/2ML IV SOLN
INTRAVENOUS | Status: DC | PRN
  Administered 2020-07-10: 300 mg via INTRAVENOUS

## 2020-07-10 MED ORDER — LABETALOL HCL 5 MG/ML IV SOLN: 10.0000 mg | INTRAVENOUS | Status: DC | PRN

## 2020-07-10 MED ORDER — PHENYLEPHRINE HCL-NACL 10-0.9 MG/250ML-% IV SOLN
INTRAVENOUS | Status: AC
  Filled 2020-07-10: qty 250

## 2020-07-10 MED ORDER — ONDANSETRON HCL 4 MG PO TABS: 4.0000 mg | ORAL_TABLET | Freq: Four times a day (QID) | ORAL | Status: DC | PRN

## 2020-07-10 MED ORDER — MIDAZOLAM HCL 2 MG/2ML IJ SOLN
INTRAMUSCULAR | Status: DC | PRN
  Administered 2020-07-10: 2 mg via INTRAVENOUS

## 2020-07-10 MED ORDER — LACTATED RINGERS IV SOLN: INTRAVENOUS | Status: DC | PRN

## 2020-07-10 MED ORDER — SODIUM CHLORIDE 0.9% FLUSH: 9.0000 mL | INTRAVENOUS | Status: DC | PRN

## 2020-07-10 MED ORDER — HYDROMORPHONE HCL 1 MG/ML IJ SOLN
0.2500 mg | INTRAMUSCULAR | Status: DC | PRN
  Administered 2020-07-10 (×5): 0.5 mg via INTRAVENOUS

## 2020-07-10 MED ORDER — ALUM & MAG HYDROXIDE-SIMETH 200-200-20 MG/5ML PO SUSP: 15.0000 mL | ORAL | Status: DC | PRN

## 2020-07-10 MED ORDER — NALOXONE HCL 0.4 MG/ML IJ SOLN: 0.4000 mg | INTRAMUSCULAR | Status: DC | PRN

## 2020-07-10 MED ORDER — PROMETHAZINE HCL 25 MG/ML IJ SOLN: 6.2500 mg | INTRAMUSCULAR | Status: DC | PRN

## 2020-07-10 MED ORDER — BISACODYL 10 MG RE SUPP: 10.0000 mg | Freq: Every day | RECTAL | Status: DC | PRN

## 2020-07-10 MED ORDER — ACETAMINOPHEN 500 MG PO TABS
1000.0000 mg | ORAL_TABLET | Freq: Three times a day (TID) | ORAL | Status: DC
  Administered 2020-07-10 – 2020-07-15 (×14): 1000 mg via ORAL
  Filled 2020-07-10 (×14): qty 2

## 2020-07-10 MED ORDER — ARTIFICIAL TEARS OPHTHALMIC OINT
TOPICAL_OINTMENT | OPHTHALMIC | Status: AC
  Filled 2020-07-10: qty 7

## 2020-07-10 MED ORDER — ENOXAPARIN SODIUM 40 MG/0.4ML IJ SOSY: 40.0000 mg | PREFILLED_SYRINGE | INTRAMUSCULAR | Status: DC

## 2020-07-10 MED ORDER — ZINC SULFATE 220 (50 ZN) MG PO CAPS
220.0000 mg | ORAL_CAPSULE | Freq: Every day | ORAL | Status: DC
  Administered 2020-07-11 – 2020-07-15 (×5): 220 mg via ORAL
  Filled 2020-07-10 (×5): qty 1

## 2020-07-10 MED ORDER — CEFAZOLIN SODIUM 1 G IJ SOLR
INTRAMUSCULAR | Status: AC
  Filled 2020-07-10: qty 20

## 2020-07-10 MED ORDER — LIDOCAINE HCL (CARDIAC) PF 100 MG/5ML IV SOSY
PREFILLED_SYRINGE | INTRAVENOUS | Status: DC | PRN
  Administered 2020-07-10: 60 mg via INTRAVENOUS

## 2020-07-10 MED ORDER — KETAMINE HCL 10 MG/ML IJ SOLN
INTRAMUSCULAR | Status: DC | PRN
  Administered 2020-07-10: 20 mg via INTRAVENOUS
  Administered 2020-07-10: 30 mg via INTRAVENOUS

## 2020-07-10 MED ORDER — TRAMADOL HCL 50 MG PO TABS
50.0000 mg | ORAL_TABLET | Freq: Four times a day (QID) | ORAL | Status: DC
  Administered 2020-07-10 – 2020-07-15 (×16): 50 mg via ORAL
  Filled 2020-07-10 (×18): qty 1

## 2020-07-10 MED ORDER — DIPHENHYDRAMINE HCL 12.5 MG/5ML PO ELIX: 12.5000 mg | ORAL_SOLUTION | Freq: Four times a day (QID) | ORAL | Status: DC | PRN

## 2020-07-10 MED ORDER — OXYCODONE HCL 5 MG PO TABS
10.0000 mg | ORAL_TABLET | ORAL | Status: DC | PRN
  Administered 2020-07-14: 10 mg via ORAL
  Administered 2020-07-14: 15 mg via ORAL
  Filled 2020-07-10: qty 3
  Filled 2020-07-10: qty 2
  Filled 2020-07-10: qty 3

## 2020-07-10 MED ORDER — SODIUM CHLORIDE 0.9 % IR SOLN
Status: DC | PRN
  Administered 2020-07-10: 1000 mL

## 2020-07-10 MED ORDER — METOCLOPRAMIDE HCL 5 MG/ML IJ SOLN: 5.0000 mg | Freq: Three times a day (TID) | INTRAMUSCULAR | Status: DC | PRN

## 2020-07-10 MED ORDER — METHOCARBAMOL 500 MG PO TABS
500.0000 mg | ORAL_TABLET | Freq: Four times a day (QID) | ORAL | Status: DC | PRN
  Administered 2020-07-10 – 2020-07-12 (×2): 500 mg via ORAL
  Filled 2020-07-10 (×2): qty 1

## 2020-07-10 MED ORDER — DIPHENHYDRAMINE HCL 50 MG/ML IJ SOLN
INTRAMUSCULAR | Status: AC
  Filled 2020-07-10: qty 2

## 2020-07-10 MED ORDER — MAGNESIUM CITRATE PO SOLN: 1.0000 | Freq: Once | ORAL | Status: DC | PRN

## 2020-07-10 MED ORDER — 0.9 % SODIUM CHLORIDE (POUR BTL) OPTIME
TOPICAL | Status: DC | PRN
  Administered 2020-07-10: 1000 mL

## 2020-07-10 MED ORDER — METOCLOPRAMIDE HCL 5 MG PO TABS: 5.0000 mg | ORAL_TABLET | Freq: Three times a day (TID) | ORAL | Status: DC | PRN

## 2020-07-10 MED ORDER — KETAMINE HCL 50 MG/5ML IJ SOSY
PREFILLED_SYRINGE | INTRAMUSCULAR | Status: AC
  Filled 2020-07-10: qty 5

## 2020-07-10 MED ORDER — HYDROMORPHONE HCL 1 MG/ML IJ SOLN
0.5000 mg | INTRAMUSCULAR | Status: DC | PRN
  Administered 2020-07-10 (×3): 1 mg via INTRAVENOUS
  Filled 2020-07-10 (×3): qty 1

## 2020-07-10 MED ORDER — KETOROLAC TROMETHAMINE 15 MG/ML IJ SOLN
7.5000 mg | Freq: Four times a day (QID) | INTRAMUSCULAR | Status: AC
  Administered 2020-07-11 (×2): 7.5 mg via INTRAVENOUS
  Filled 2020-07-10 (×2): qty 1

## 2020-07-10 MED ORDER — METOPROLOL TARTRATE 5 MG/5ML IV SOLN
2.0000 mg | INTRAVENOUS | Status: DC | PRN
Start: 2020-07-10 — End: 2020-07-15

## 2020-07-10 MED ORDER — POVIDONE-IODINE 10 % EX SWAB
2.0000 "application " | Freq: Once | CUTANEOUS | Status: AC
  Administered 2020-07-10: 2 via TOPICAL

## 2020-07-10 MED ORDER — ONDANSETRON HCL 4 MG/2ML IJ SOLN
INTRAMUSCULAR | Status: AC
  Filled 2020-07-10: qty 4

## 2020-07-10 MED ORDER — OXYCODONE HCL 5 MG PO TABS
10.0000 mg | ORAL_TABLET | ORAL | Status: DC | PRN
  Administered 2020-07-10: 15 mg via ORAL
  Filled 2020-07-10: qty 3

## 2020-07-10 MED ORDER — ACETAMINOPHEN 325 MG PO TABS
325.0000 mg | ORAL_TABLET | Freq: Four times a day (QID) | ORAL | Status: DC | PRN
  Filled 2020-07-10: qty 2

## 2020-07-10 MED ORDER — ONDANSETRON HCL 4 MG/2ML IJ SOLN
4.0000 mg | Freq: Four times a day (QID) | INTRAMUSCULAR | Status: DC | PRN
Start: 2020-07-10 — End: 2020-07-10

## 2020-07-10 MED ORDER — PHENYLEPHRINE 40 MCG/ML (10ML) SYRINGE FOR IV PUSH (FOR BLOOD PRESSURE SUPPORT)
PREFILLED_SYRINGE | INTRAVENOUS | Status: DC | PRN
  Administered 2020-07-10: 80 ug via INTRAVENOUS
  Administered 2020-07-10: 120 ug via INTRAVENOUS
  Administered 2020-07-10: 80 ug via INTRAVENOUS
  Administered 2020-07-10: 120 ug via INTRAVENOUS

## 2020-07-10 MED ORDER — ROCURONIUM BROMIDE 10 MG/ML (PF) SYRINGE
PREFILLED_SYRINGE | INTRAVENOUS | Status: DC | PRN
  Administered 2020-07-10: 60 mg via INTRAVENOUS

## 2020-07-10 MED ORDER — OXYCODONE HCL 5 MG PO TABS: 5.0000 mg | ORAL_TABLET | ORAL | Status: DC | PRN

## 2020-07-10 MED ORDER — PHENYLEPHRINE HCL (PRESSORS) 10 MG/ML IV SOLN
INTRAVENOUS | Status: DC | PRN
  Administered 2020-07-10 (×2): 80 ug via INTRAVENOUS

## 2020-07-10 MED ORDER — ROCURONIUM BROMIDE 100 MG/10ML IV SOLN
INTRAVENOUS | Status: DC | PRN
  Administered 2020-07-10: 60 mg via INTRAVENOUS

## 2020-07-10 MED ORDER — HYDRALAZINE HCL 20 MG/ML IJ SOLN
5.0000 mg | INTRAMUSCULAR | Status: DC | PRN
Start: 2020-07-10 — End: 2020-07-15

## 2020-07-10 MED ORDER — CHLORHEXIDINE GLUCONATE 0.12 % MT SOLN
OROMUCOSAL | Status: AC
  Administered 2020-07-10: 15 mL
  Filled 2020-07-10: qty 15

## 2020-07-10 MED ORDER — ENSURE PRE-SURGERY PO LIQD
296.0000 mL | Freq: Once | ORAL | Status: AC
  Administered 2020-07-10: 296 mL via ORAL
  Filled 2020-07-10: qty 296

## 2020-07-10 MED ORDER — VITAMIN D 25 MCG (1000 UNIT) PO TABS
2000.0000 [IU] | ORAL_TABLET | Freq: Two times a day (BID) | ORAL | Status: DC
  Administered 2020-07-10 – 2020-07-15 (×10): 2000 [IU] via ORAL
  Filled 2020-07-10 (×10): qty 2

## 2020-07-10 MED ORDER — DEXAMETHASONE SODIUM PHOSPHATE 10 MG/ML IJ SOLN
INTRAMUSCULAR | Status: DC | PRN
  Administered 2020-07-10: 5 mg via INTRAVENOUS

## 2020-07-10 MED ORDER — PANTOPRAZOLE SODIUM 40 MG PO TBEC
40.0000 mg | DELAYED_RELEASE_TABLET | Freq: Every day | ORAL | Status: DC
  Administered 2020-07-10 – 2020-07-15 (×6): 40 mg via ORAL
  Filled 2020-07-10 (×7): qty 1

## 2020-07-10 MED ORDER — PROPOFOL 10 MG/ML IV BOLUS
INTRAVENOUS | Status: DC | PRN
  Administered 2020-07-10: 110 mg via INTRAVENOUS

## 2020-07-10 SURGICAL SUPPLY — 57 items
BLADE OSCILLATING /SAGITTAL (BLADE) IMPLANT
BLADE SAW RECIP 87.9 MT (BLADE) ×2 IMPLANT
BLADE SAW SGTL 81X20 HD (BLADE) ×2 IMPLANT
BLADE SURG 10 STRL SS (BLADE) ×2 IMPLANT
BNDG COHESIVE 4X5 TAN STRL (GAUZE/BANDAGES/DRESSINGS) ×2 IMPLANT
BNDG ELASTIC 4X5.8 VLCR STR LF (GAUZE/BANDAGES/DRESSINGS) ×2 IMPLANT
BNDG ELASTIC 6X10 VLCR STRL LF (GAUZE/BANDAGES/DRESSINGS) ×4 IMPLANT
BNDG ELASTIC 6X5.8 VLCR STR LF (GAUZE/BANDAGES/DRESSINGS) ×2 IMPLANT
BNDG GAUZE ELAST 4 BULKY (GAUZE/BANDAGES/DRESSINGS) ×2 IMPLANT
BRUSH SCRUB EZ PLAIN DRY (MISCELLANEOUS) ×4 IMPLANT
CANISTER WOUNDNEG PRESSURE 500 (CANNISTER) ×2 IMPLANT
COVER MAYO STAND STRL (DRAPES) ×2 IMPLANT
COVER SURGICAL LIGHT HANDLE (MISCELLANEOUS) ×4 IMPLANT
CUFF TOURN SGL QUICK 34 (TOURNIQUET CUFF) ×1
CUFF TRNQT CYL 34X4.125X (TOURNIQUET CUFF) ×1 IMPLANT
DRAPE EXTREMITY T 121X128X90 (DISPOSABLE) ×2 IMPLANT
DRAPE HALF SHEET 40X57 (DRAPES) ×6 IMPLANT
DRAPE U-SHAPE 47X51 STRL (DRAPES) ×2 IMPLANT
DRSG ADAPTIC 3X8 NADH LF (GAUZE/BANDAGES/DRESSINGS) ×2 IMPLANT
DRSG MEPITEL 4X7.2 (GAUZE/BANDAGES/DRESSINGS) ×2 IMPLANT
DRSG PAD ABDOMINAL 8X10 ST (GAUZE/BANDAGES/DRESSINGS) ×4 IMPLANT
DRSG VAC ATS MED SENSATRAC (GAUZE/BANDAGES/DRESSINGS) ×2 IMPLANT
GAUZE SPONGE 4X4 12PLY STRL (GAUZE/BANDAGES/DRESSINGS) ×2 IMPLANT
GLOVE BIO SURGEON STRL SZ7.5 (GLOVE) ×2 IMPLANT
GLOVE BIO SURGEON STRL SZ8 (GLOVE) ×2 IMPLANT
GLOVE BIOGEL PI IND STRL 7.5 (GLOVE) ×1 IMPLANT
GLOVE BIOGEL PI INDICATOR 7.5 (GLOVE) ×1
GLOVE SRG 8 PF TXTR STRL LF DI (GLOVE) ×1 IMPLANT
GLOVE SURG UNDER POLY LF SZ8 (GLOVE) ×1
GOWN STRL REUS W/ TWL LRG LVL3 (GOWN DISPOSABLE) ×2 IMPLANT
GOWN STRL REUS W/ TWL XL LVL3 (GOWN DISPOSABLE) ×1 IMPLANT
GOWN STRL REUS W/TWL LRG LVL3 (GOWN DISPOSABLE) ×2
GOWN STRL REUS W/TWL XL LVL3 (GOWN DISPOSABLE) ×1
IMMOBILIZER KNEE 20 (SOFTGOODS) ×2
IMMOBILIZER KNEE 20 THIGH 36 (SOFTGOODS) ×1 IMPLANT
KIT TURNOVER KIT B (KITS) ×2 IMPLANT
MANIFOLD NEPTUNE II (INSTRUMENTS) ×2 IMPLANT
NS IRRIG 1000ML POUR BTL (IV SOLUTION) ×2 IMPLANT
PACK GENERAL/GYN (CUSTOM PROCEDURE TRAY) ×2 IMPLANT
PAD ARMBOARD 7.5X6 YLW CONV (MISCELLANEOUS) ×4 IMPLANT
SPONGE LAP 18X18 RF (DISPOSABLE) ×4 IMPLANT
STOCKINETTE IMPERVIOUS LG (DRAPES) ×2 IMPLANT
SUT ETHILON 2 0 PSLX (SUTURE) ×6 IMPLANT
SUT PDS AB 2-0 CT1 27 (SUTURE) ×2 IMPLANT
SUT PROLENE 0 CT 1 30 (SUTURE) ×2 IMPLANT
SUT PROLENE 2 0 CT 1 (SUTURE) ×2 IMPLANT
SUT SILK 2 0 SH CR/8 (SUTURE) ×2 IMPLANT
SUT SILK 2 0 TIES 10X30 (SUTURE) ×2 IMPLANT
SUT VIC AB 0 CT1 27 (SUTURE) ×1
SUT VIC AB 0 CT1 27XBRD ANBCTR (SUTURE) ×1 IMPLANT
SUT VIC AB 1 CT1 27 (SUTURE) ×1
SUT VIC AB 1 CT1 27XBRD ANBCTR (SUTURE) ×1 IMPLANT
SUT VIC AB 2-0 CT1 27 (SUTURE) ×2
SUT VIC AB 2-0 CT1 TAPERPNT 27 (SUTURE) ×2 IMPLANT
TOWEL GREEN STERILE (TOWEL DISPOSABLE) ×4 IMPLANT
TOWEL GREEN STERILE FF (TOWEL DISPOSABLE) ×2 IMPLANT
TUBE CONNECTING 12X1/4 (SUCTIONS) ×2 IMPLANT

## 2020-07-10 NOTE — Progress Notes (Signed)
Called MD Percell Miller to update on patients diet order and pain management. MD stated to call his PA. Notified PA and left message to update. No new orders at this time.
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
## 2020-07-10 NOTE — Progress Notes (Signed)
Pt has arrived to 5N10 from PACU. He is in no distress at this time. Wound vac is plugged in to wall outlet with no drainage in cannister. Pt complains of no pain. He is alert and oriented x4. Will continue to monitor. VSS.

## 2020-07-10 NOTE — Consult Note (Signed)
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 chest, abdomen and pelvis was performed following the standard protocol without IV contrast. COMPARISON:  None. FINDINGS: CT HEAD FINDINGS Brain: Normal anatomic configuration. No abnormal intra or extra-axial mass lesion or fluid collection. No abnormal mass effect or midline shift. No evidence of acute intracranial hemorrhage or infarct. Ventricular size is normal. Cerebellum unremarkable. Vascular: Unremarkable Skull: Intact Sinuses/Orbits: There is extensive mucosal thickening within the maxillary sinuses bilaterally. Remaining paranasal sinuses are clear. Orbits are unremarkable. Other: Mastoid air cells and middle ear cavities are clear. CT CERVICAL FINDINGS Alignment: Normal. Skull base and vertebrae: No acute fracture. No primary bone lesion or focal pathologic process. Soft tissues and spinal canal: No prevertebral fluid or swelling. No visible canal hematoma. Disc levels: Intervertebral disc heights are preserved. Vertebral body heights are  preserved. Prevertebral soft tissues are not thickened on sagittal reformats. The spinal canal is widely patent on sagittal reformats. Review of the axial images demonstrates mild multilevel uncovertebral arthrosis resulting in mild bilateral neuroforaminal narrowing at C3-4, on the right at C4-5, and bilaterally at C5-6. Other: None CT CHEST FINDINGS Cardiovascular: No significant vascular findings. Normal heart size. No pericardial effusion. Mediastinum/Nodes: No enlarged mediastinal, hilar, or axillary lymph nodes. Thyroid gland, trachea, and esophagus demonstrate no significant findings. Lungs/Pleura: Moderate centrilobular emphysema. Mild right apical asymmetric pulmonary scarring. The  lungs are otherwise clear. No pneumothorax or pleural effusion. The central airways are widely patent. Musculoskeletal: No acute bone abnormality. No lytic or blastic bone lesions. CT ABDOMEN AND PELVIS FINDINGS Hepatobiliary: No focal liver abnormality is seen. No gallstones, gallbladder wall thickening, or biliary dilatation. Pancreas: Unremarkable Spleen: Unremarkable Adrenals/Urinary Tract: Adrenal glands are unremarkable. Kidneys are normal, without renal calculi, focal lesion, or hydronephrosis. Bladder is unremarkable. Stomach/Bowel: The appendix is absent. The stomach, small bowel, and large bowel are unremarkable. No free intraperitoneal gas or fluid. Vascular/Lymphatic: Mild aortoiliac atherosclerotic calcification. No aortic aneurysm. No pathologic adenopathy within the abdomen and pelvis. Reproductive: Prostate is unremarkable. Other: No abdominal wall hernia.  Rectum unremarkable. Musculoskeletal: L3-S1 anterior and posterior lumbar fusion with instrumentation has been performed. No acute bone abnormality identified within the abdomen and pelvis. No lytic or blastic bone lesions. IMPRESSION: No acute intracranial injury.  No calvarial fracture. Extensive bilateral maxillary paranasal sinus disease. No acute fracture  or listhesis of the cervical spine. No acute intrathoracic or intra-abdominal injury. Moderate centrilobular emphysema. Aortic Atherosclerosis (ICD10-I70.0) and Emphysema (ICD10-J43.9). Electronically Signed   By: Fidela Salisbury MD   On: 07/09/2020 23:30   Anti-infectives (From admission, onward)   Start     Dose/Rate Route Frequency Ordered Stop   07/10/20 1000  cefTRIAXone (ROCEPHIN) 2 g in sodium chloride 0.9 % 100 mL IVPB        2 g 200 mL/hr over 30 Minutes Intravenous Every 24 hours 07/10/20 0308 07/13/20 0959   07/10/20 0600  ceFAZolin (ANCEF) IVPB 2g/100 mL premix        2 g 200 mL/hr over 30 Minutes Intravenous Every 6 hours 07/10/20 0308 07/10/20 2359        Assessment/Plan  [facility] Open right tib/fib fx - s/p I&D and ex fix placement 5/19 Dr. Percell Snyder. Plan per ortho Scattered abrasions - local wound care ABL anemia - Hgb 11.4 from 15, no current hypotension or tachycardia, repeat CBC in AM Elevated Cr - 1.4 on admission, now 1.15, good UOP, improved Concussion - does not seem to have any residual symptoms at this time but recommend TBI team therapies when ok with ortho C-spine cleared Chronic back pain  Chronic opioid use - 2/2 above. Takes oxycodone 30mg  5x per day Tobacco abuse - encourage cessation  ID - ancef 5/19, rocephin 5/19>> VTE - lovenox FEN - reg diet, NPO after MN Foley - condemn cath  Plan - No new injuries noted on tertiary survey. Recommend TBI team therapies. Pain management will be difficult given chronic narcotic use. Trauma will sign off, please call with questions or concerns.   Wellington Hampshire, [facility] 07/10/2020, 11:03 AM Please see Amion for pager number during day hours 7:00am-4:30pm

## 2020-07-10 NOTE — ED Notes (Addendum)
Emergency Conscious Sedation for right LLE Reduction [facility], [place], [place] MD, Ortho tech present Time out completed  21:56 Fentanyl 144mcg  21:58 Ketamine 25mg  21:59 Ketamine 25mg  22:00 Ketamine 25mg   Limb reduced and splinted by ortho tech, color return to foot noted post reduction

## 2020-07-10 NOTE — Progress Notes (Signed)
Was able to speak with wife and update her. She was on the phone when I called.   Altamese [place], MD [facility], [facility] [phone]

## 2020-07-10 NOTE — Anesthesia Preprocedure Evaluation (Addendum)
Anesthesia Evaluation  Patient identified by MRN, date of birth, ID band Patient awake    Reviewed: Allergy & Precautions, H&P , NPO status , Patient's Chart, lab work & pertinent test results  Airway Mallampati: II  TM Distance: >3 FB Neck ROM: Full    Dental no notable dental hx. (+) Teeth Intact, Dental Advisory Given   Pulmonary Current Smoker and Patient abstained from smoking.,    Pulmonary exam normal breath sounds clear to auscultation       Cardiovascular negative cardio ROS   Rhythm:Regular Rate:Normal     Neuro/Psych negative neurological ROS  negative psych ROS   GI/Hepatic negative GI ROS, Neg liver ROS,   Endo/Other  negative endocrine ROS  Renal/GU negative Renal ROS  negative genitourinary   Musculoskeletal   Abdominal   Peds  Hematology negative hematology ROS (+)   Anesthesia Other Findings   Reproductive/Obstetrics negative OB ROS                            Anesthesia Physical Anesthesia Plan  ASA: II  Anesthesia Plan: General   Post-op Pain Management:  Regional for Post-op pain   Induction: Intravenous  PONV Risk Score and Plan: 2 and Ondansetron, Dexamethasone and Midazolam  Airway Management Planned: Oral ETT  Additional Equipment:   Intra-op Plan:   Post-operative Plan: Extubation in OR  Informed Consent: I have reviewed the patients History and Physical, chart, labs and discussed the procedure including the risks, benefits and alternatives for the proposed anesthesia with the patient or authorized representative who has indicated his/her understanding and acceptance.     Dental advisory given  Plan Discussed with: CRNA  Anesthesia Plan Comments:        Anesthesia Quick Evaluation

## 2020-07-10 NOTE — ED Notes (Signed)
Patient transported to CT 

## 2020-07-10 NOTE — Anesthesia Postprocedure Evaluation (Signed)
Anesthesia Post Note  Patient: Lavontae Cornia  Procedure(s) Performed: EXTERNAL FIXATION RIGHT TIB/FIB   Wash out (Right Leg Lower)     Patient location during evaluation: PACU Anesthesia Type: General Level of consciousness: awake and alert Pain management: pain level controlled Vital Signs Assessment: post-procedure vital signs reviewed and stable Respiratory status: spontaneous breathing, nonlabored ventilation, respiratory function stable and patient connected to nasal cannula oxygen Cardiovascular status: stable and blood pressure returned to baseline Anesthetic complications: no   No complications documented.  Last Vitals:  Vitals:   07/10/20 0300 07/10/20 0505  BP: (!) 155/103 (!) 185/100  Pulse: 82 85  Resp: 18 18  Temp: 37.2 C 36.7 C  SpO2: 94% 99%               Audry Pili

## 2020-07-10 NOTE — Progress Notes (Signed)
Received patient from PACU awake,alert/orientedx4. Patient not cooperative in answering admission questions at this time due to increased pain. VSS; NAD noted; respirations even on 2L O2 via nasal cannula. Movement/sensation noted to b/l upper extremities. Movement noted to LLE. Right foot cold to touch and mottled; ex-fix in place and patient states decreased sensation and unable to try to wiggle toes due to pain. Condom cath on. Tele monitor and continuous pulse ox in place per orders. Whiteboard updated. All safety measures in place and personal belongings within reach.

## 2020-07-10 NOTE — Progress Notes (Addendum)
Because Mrs. Mele had recently had cervical spine surgery, she went home with her sister during the operation and so no family was available in waiting at the conclusion. I went to the patient room but no one was there either.  I called both numbers available and left a nonspecific message on wife's phone that things had gone well and as planned, indicating he would be upstairs in an hour.  Unfortunately I do not see a number for patient's sister-in-law.  Please call me if they become available and wish to speak further tonight.  I will also reach out again.  Altamese [place], MD [facility], [facility] [phone]

## 2020-07-10 NOTE — Transfer of Care (Signed)
Immediate Anesthesia Transfer of Care Note  Patient: Kaci Freel  Procedure(s) Performed: EXTERNAL FIXATION RIGHT TIB/FIB   Wash out (Right Leg Lower)  Patient Location: PACU  Anesthesia Type:General  Level of Consciousness: drowsy  Airway & Oxygen Therapy: Patient Spontanous Breathing and Patient connected to nasal cannula oxygen  Post-op Assessment: Report given to RN, Post -op Vital signs reviewed and stable and Patient moving all extremities  Post vital signs: Reviewed and stable  Last Vitals:  Vitals Value Taken Time  BP 140/95 07/10/20 0123  Temp    Pulse 84 07/10/20 0126  Resp 16 07/10/20 0126  SpO2 98 % 07/10/20 0126  Vitals shown include unvalidated device data.  Last Pain:  Vitals:   07/09/20 2315  TempSrc:   PainSc: 10-Worst pain ever         Complications: No complications documented.

## 2020-07-10 NOTE — Op Note (Addendum)
07/10/2020  8:27 PM  PATIENT:  Eric Snyder  55 y.o. male  PRE-OPERATIVE DIAGNOSIS:   1. Type 3B open right ankle pilon, tibia and fibula 2. Right ankle spanning external fixator  POST-OPERATIVE DIAGNOSIS:   1. Type 3B open right ankle pilon, tibia and fibula 2. Right ankle spanning external fixator  PROCEDURE:  Procedure(s): 1. REMOVAL OF EXTERNAL FIXATOR UNDER ANESTHESIA 2. RIGHT BELOW KNEE AMPUTATION 3. APPLICATION OF SMALL WOUND VAC  SURGEON:  Surgeon(s) and Role:    Marcelino Scot, Legrand Como, MD - Primary  PHYSICIAN ASSISTANT: Ainsley Spinner, PA-C  ANESTHESIA:   general and regional  I/O:  Total I/O In: 1200 [I.V.:1200] Out: 100 [Blood:100]  SPECIMEN:  Source of Specimen:  Right foot  TOURNIQUET:   Total Tourniquet Time Documented: Thigh (Right) - 47 minutes Total: Thigh (Right) - 47 minutes   COMPLICATIONS: NONE  DICTATION: .Note written in EPIC  DISPOSITION: TO PACU  CONDITION: STABLE  DELAY START OF DVT PROPHYLAXIS BECAUSE OF BLEEDING RISK: NO   INDICATIONS:  [name] is a very 55 y.o. who sustained Type 3B open right pilon and multiple midfoot and forefoot fractures with extensive bone loss, extensive soft tissue loss, exposed tendons, destroyed foot with inability to adequately wash out the open fractures without skin necrosis given his one remaining artery, and loss of plantar and deep peroneal nerve sensation. In addition, patient has strong smoking history and not too long ago smoked 2 PPD. I discussed at length. Despite aggressive early debridement the leg was deemed unsalvageable given the combined injuries. Amputation offers the most potential for reduced pain and greatest function. I have discussed this with the patient, his wife, and sister-in-law. I have also discussed with Dr. Fredonia Highland and Dr. Servando Snare from the Vascular Service. Risks discussed included phantom pain, infection, heterotopic bone, other nerve or vessel injury, bleeding, loss of  motion, DVT, PE, heart attack, stroke and others. After acknowledgement of these risks, consent was provided to proceed.  SUMMARY OF PROCEDURE:  The patient was given preoperative antibiotics, taken to the operating room where general anesthesia was induced.  The lower extremity underwent a removal of the external fixator after anesthesia.  There was some mild  ulceration noted of the pin sites in the anterior cortex of the tibia but this was not formally debrided given the short time frame.  A standard Betadine scrub and paint was then performed and draped using an impervious stockinette over the foot with Coban.  Incision was marked so as to avoid the pin tract sites  anteriorly.  A fishmouth incision was used with standard posterior based muscle flap.  The leg was elevated and then a tourniquet inflated to 325 mmHg. In order to reduce field contamination I used the amputation knee when performed a guillotine amputation through the fracture site distally and clamped the posterior tibial artery and passed off the foot.  As for the formal below knee amputation, I began by making the skin and fascial incisions with a 10 blade scalpel.  I then went through the anterior compartment clamping the anterior vessels.  We also incised through the lateral compartment and exposed the posterior tibia medially placing a Bennett underneath the tibia from that side and a Cobb from the lateral side then performing the cut and beveling the anterior cortex as well. The fibula was resected short of the tibia. This was followed by use of the amputation knife through the posterior tissues.  Clamps were used to control the posterior vessels.  I then divided the anterior tibial artery, vein and nerve, ligating those separately two times for the arteries and once for the vein. The nerves were cut sharply deep within the tissues.  Similarly for the posterior compartment, this was performed, isolating the posterior tibial artery and  nerve.  The muscle was well perfused. Tourniquet was deflated.  Hemostasis was obtained. Irrigation performed aggressively of bone dust and the soft tissues. Closure was performed using a #1 for the deep tissues and flap and then a 2-0 Vicryl and 2-0 nylon for the skin. Because of the traumatized extremity, we applied a wound vac over punctate wounds, the anterior pin sites, and the incision.  A sterile gently compressive dressing was applied with an incisional VAC. After the Kerlix and Ace bandage, he was then placed in a knee immobilizer with a padding underneath the stump to keep him in full extension.  The patient was taken to the PACU in stable condition.  Ainsley Spinner, PA-C, assisted me  throughout and his assistance was necessary for the safe and expeditious treatment of the patient as we did work simultaneously with closure and he allowed for continued adequate control of the neurovascular bundles.  PROGNOSIS:  The patient will have his dressing changed in the next 72 hours.  We anticipate immediate application of a stump shrinker.  He will be allowed unrestricted range of motion and a prosthetic fitting to be coordinated with Prosthetics and progression to weight bearing as the wound and soft tissue allows.

## 2020-07-10 NOTE — Progress Notes (Signed)
Unable to doppler right dorsalis pedal pulse or right  posterior tibial pulse.  Cap refill is 3 seconds in the right foot.  Right foot is dusky, mottled.  Right popliteal pulse was dopplered.  Patient does have sensation in right foot and was able to wiggle his toes to command.  Dr. Percell Miller made aware.  No new orders received.  At Dr. Debroah Loop request the ace wrap was loosened.  Will continue to monitor.

## 2020-07-10 NOTE — Anesthesia Postprocedure Evaluation (Signed)
Anesthesia Post Note  Patient: Eric Snyder  Procedure(s) Performed: IRRIGATION AND DEBRIDEMENT OF ANKLE with foot/ankle  AMPUTATION (Right Ankle)     Patient location during evaluation: PACU Anesthesia Type: General and Regional Level of consciousness: awake and patient cooperative Pain management: pain level controlled Vital Signs Assessment: post-procedure vital signs reviewed and stable Respiratory status: spontaneous breathing, nonlabored ventilation, respiratory function stable and patient connected to nasal cannula oxygen Cardiovascular status: blood pressure returned to baseline and stable Postop Assessment: no apparent nausea or vomiting Anesthetic complications: no   No complications documented.  Last Vitals:  Vitals:   07/10/20 2015 07/10/20 2030  BP: 132/87 131/77  Pulse: 70 76  Resp: 10 17  Temp: 36.9 C   SpO2: 98% 94%    Last Pain:  Vitals:   07/10/20 2015  TempSrc:   PainSc: 0-No pain                 Marolyn Urschel

## 2020-07-10 NOTE — Progress Notes (Signed)
PA Gawne returned call and updated on patient and his needs. No new orders at this time.

## 2020-07-10 NOTE — Op Note (Addendum)
07/10/2020  10:34 AM  PATIENT:  Eric Snyder    PRE-OPERATIVE DIAGNOSIS:  Open tib/fib right leg  POST-OPERATIVE DIAGNOSIS:  Same  PROCEDURE:  EXTERNAL FIXATION RIGHT TIB/FIB   Wash out  SURGEON:  Renette Butters, MD  ASSISTANT: Aggie Moats, PA-C, he was present and scrubbed throughout the case, critical for completion in a timely fashion, and for retraction, instrumentation, and closure.   ANESTHESIA:   gen  PREOPERATIVE INDICATIONS:  Eric Snyder is a  55 y.o. male with a diagnosis of Open tib/fib right leg who failed conservative measures and elected for surgical management.    The risks benefits and alternatives were discussed with the patient preoperatively including but not limited to the risks of infection, bleeding, nerve injury, cardiopulmonary complications, the need for revision surgery, among others, and the patient was willing to proceed.  OPERATIVE IMPLANTS: stryker ex fix  OPERATIVE FINDINGS: Very unstable fracture large soft tissue injury over his anterior and medial leg at the site of the fracture multiple tendon injuries no arterial bleeding  BLOOD LOSS: 761YW  COMPLICATIONS: None  TOURNIQUET TIME: None  OPERATIVE PROCEDURE:  Patient was identified in the preoperative holding area and site was marked by me He was transported to the operating theater and placed on the table in supine position taking care to pad all bony prominences. After a preincinduction time out anesthesia was induced. The right lower extremity was prepped and draped in normal sterile fashion and a pre-incision timeout was performed. He received Ancef for preoperative antibiotics.   I examined his right lower extremity he had a very unstable pilon and distal tibia fracture I irrigated this with 3 L of saline debrided of all nonvitalized tissue down to the level of bone.  I explored his wound I did not note any arterial injury multiple tendons FHL and posterior tib were identified I did  clear these of soft tissue structures.  Effectively performing a tenolysis were these were trapped around bone fragments  Next I created a delta frame external fixator stabilizing his ankle in the place I took multiple x-rays confirming this. This stabilized his Pilon fracture as well as the tibial shaft component.   Sterile dressing was then applied I splinted his forefoot fractures in the place as well  He was woken taken to PACU in stable condition  POST OPERATIVE PLAN: Nonweightbearing elevate eval with trauma service tomorrow morning  Debridement type: Excisional Debridement  Side: right  Body Location: lower leg   Tools used for debridement: rongeur  Pre-debridement Wound size (cm):   Length: 20         Width: 10     Depth: 4   Post-debridement Wound size (cm):   Length: 20        Width: 10     Depth: 4   Debridement depth beyond dead/damaged tissue down to healthy viable tissue: no  Tissue layer involved: skin, subcutaneous tissue, muscle / fascia, bone  Nature of tissue removed: Devitalized Tissue  Irrigation volume: 3L     Irrigation fluid type: Normal Saline

## 2020-07-10 NOTE — Transfer of Care (Signed)
Immediate Anesthesia Transfer of Care Note  Patient: Eric Snyder  Procedure(s) Performed: IRRIGATION AND DEBRIDEMENT OF ANKLE POSSIBLE AMPUTATION (Right Ankle)  Patient Location: PACU  Anesthesia Type:General  Level of Consciousness: awake, alert  and oriented  Airway & Oxygen Therapy: Patient Spontanous Breathing and Patient connected to face mask oxygen  Post-op Assessment: Report given to RN and Post -op Vital signs reviewed and stable  Post vital signs: Reviewed and stable  Last Vitals:  Vitals Value Taken Time  BP 132/87 07/10/20 2013  Temp    Pulse 76 07/10/20 2016  Resp 13 07/10/20 2016  SpO2 100 % 07/10/20 2016  Vitals shown include unvalidated device data.  Last Pain:  Vitals:   07/10/20 1750  TempSrc:   PainSc: 0-No pain      Patients Stated Pain Goal: 3 (58/52/77 [id number])  Complications: No complications documented.

## 2020-07-10 NOTE — Progress Notes (Signed)
Patient able to wiggle toes on command at this time.

## 2020-07-10 NOTE — Consult Note (Signed)
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 Tract: Adrenal glands are unremarkable. Kidneys are normal, without renal calculi, focal lesion, or hydronephrosis. Bladder is unremarkable. Stomach/Bowel: The appendix is absent. The stomach, small bowel, and large bowel are unremarkable. No free intraperitoneal gas or fluid. Vascular/Lymphatic: Mild aortoiliac atherosclerotic calcification. No aortic aneurysm. No pathologic adenopathy within the abdomen and pelvis. Reproductive: Prostate is unremarkable. Other: No abdominal wall hernia.  Rectum unremarkable. Musculoskeletal: L3-S1 anterior and posterior lumbar fusion with instrumentation has been performed. No acute bone abnormality identified within the abdomen and pelvis. No lytic or blastic bone lesions. IMPRESSION: No acute intracranial injury.  No calvarial fracture. Extensive bilateral maxillary paranasal sinus disease. No acute fracture or listhesis of the cervical spine. No acute intrathoracic or intra-abdominal  injury. Moderate centrilobular emphysema. Aortic Atherosclerosis (ICD10-I70.0) and Emphysema (ICD10-J43.9). Electronically Signed   By: Fidela Salisbury MD   On: 07/09/2020 23:30    Review of Systems  HENT: Negative for ear discharge, ear pain, hearing loss and tinnitus.   Eyes: Negative for photophobia and pain.  Respiratory: Negative for cough and shortness of breath.   Cardiovascular: Negative for chest pain.  Gastrointestinal: Negative for abdominal pain, nausea and vomiting.  Genitourinary: Negative for dysuria, flank pain, frequency and urgency.  Musculoskeletal: Positive for arthralgias (Left ankle). Negative for back pain, myalgias and neck pain.  Neurological: Negative for dizziness and headaches.  Hematological: Does not bruise/bleed easily.  Psychiatric/Behavioral: The patient is not nervous/anxious.    Blood pressure (!) 169/99, pulse 78, temperature 98 F (36.7 C), resp. rate 18, height 5\' 10"  (1.778 m), weight 85.3 kg, SpO2 98 %. Physical Exam Constitutional:      General: He is not in acute distress.    Appearance: He is well-developed. He is not  diaphoretic.  HENT:     Head: Normocephalic and atraumatic.  Eyes:     General: No scleral icterus.       Right eye: No discharge.        Left eye: No discharge.     Conjunctiva/sclera: Conjunctivae normal.  Cardiovascular:     Rate and Rhythm: Normal rate and regular rhythm.  Pulmonary:     Effort: Pulmonary effort is normal. No respiratory distress.  Musculoskeletal:     Cervical back: Normal range of motion.     Comments: RLE No ecchymosis or rash, ex fix in place  Toes dusky but cap refill <2s  No knee or ankle effusion  Knee stable to varus/ valgus and anterior/posterior stress  Sens DPN, SPN, TN intact  Motor EHL intact  No significant edema  Skin:    General: Skin is warm and dry.  Neurological:     Mental Status: He is alert.  Psychiatric:        Behavior: Behavior normal.     Assessment/Plan: Open  right tibia/tarsal/MT fxs -- For repeat I&D, possible ORIF tomorrow by Dr. Doreatha Martin. Will make NPO after MN. Diet today, oral pain medications, CT foot, c-spine cleared and collar removed.    Lisette Abu, PA-C Orthopedic Surgery [phone] 07/10/2020, 10:21 AM

## 2020-07-10 NOTE — Anesthesia Procedure Notes (Addendum)
Procedure Name: Intubation Date/Time: 07/10/2020 12:19 AM Performed by: Natally Ribera T, CRNA Pre-anesthesia Checklist: Patient identified, Emergency Drugs available, Suction available and Patient being monitored Patient Re-evaluated:Patient Re-evaluated prior to induction Oxygen Delivery Method: Circle system utilized Preoxygenation: Pre-oxygenation with 100% oxygen Induction Type: IV induction and Rapid sequence Ventilation: Mask ventilation without difficulty Laryngoscope Size: Glidescope and 3 Grade View: Grade I Tube type: Oral Tube size: 7.5 mm Number of attempts: 1 Airway Equipment and Method: Stylet,  Oral airway and Video-laryngoscopy Placement Confirmation: ETT inserted through vocal cords under direct vision,  positive ETCO2 and breath sounds checked- equal and bilateral Secured at: 21 cm Tube secured with: Tape Dental Injury: Teeth and Oropharynx as per pre-operative assessment
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
## 2020-07-10 NOTE — Anesthesia Procedure Notes (Signed)
Anesthesia Regional Block: Popliteal block   Pre-Anesthetic Checklist: ,, timeout performed, Correct Patient, Correct Site, Correct Laterality, Correct Procedure, Correct Position, site marked, Risks and benefits discussed, pre-op evaluation,  At surgeon's request and post-op pain management  Laterality: Right  Prep: Maximum Sterile Barrier Precautions used, chloraprep       Needles:  Injection technique: Single-shot  Needle Type: Echogenic Stimulator Needle     Needle Length: 9cm  Needle Gauge: 21     Additional Needles:   Procedures:,,,, ultrasound used (permanent image in chart),,,,  Narrative:  Start time: 07/10/2020 5:41 PM End time: 07/10/2020 5:51 PM Injection made incrementally with aspirations every 5 mL. Anesthesiologist: Roderic Palau, MD  Additional Notes: 2% Lidocaine skin wheel. Adductor canal block with 15cc of 0.5% Bupivicaine w/1:200k epi.

## 2020-07-10 NOTE — Anesthesia Procedure Notes (Signed)
Procedure Name: Intubation Date/Time: 07/10/2020 6:07 PM Performed by: Dorthea Cove, CRNA Pre-anesthesia Checklist: Patient identified, Emergency Drugs available, Suction available and Patient being monitored Patient Re-evaluated:Patient Re-evaluated prior to induction Oxygen Delivery Method: Circle system utilized Preoxygenation: Pre-oxygenation with 100% oxygen Induction Type: IV induction Ventilation: Mask ventilation without difficulty Laryngoscope Size: Mac and 4 Grade View: Grade II Tube type: Oral Tube size: 7.5 mm Number of attempts: 1 Airway Equipment and Method: Stylet and Oral airway Placement Confirmation: ETT inserted through vocal cords under direct vision,  positive ETCO2 and breath sounds checked- equal and bilateral Secured at: 22 cm Tube secured with: Tape Dental Injury: Teeth and Oropharynx as per pre-operative assessment

## 2020-07-11 ENCOUNTER — Encounter (HOSPITAL_COMMUNITY): Payer: Self-pay | Admitting: Orthopedic Surgery

## 2020-07-11 DIAGNOSIS — S92901B Unspecified fracture of right foot, initial encounter for open fracture: Secondary | ICD-10-CM

## 2020-07-11 DIAGNOSIS — G894 Chronic pain syndrome: Secondary | ICD-10-CM | POA: Diagnosis present

## 2020-07-11 DIAGNOSIS — F1190 Opioid use, unspecified, uncomplicated: Secondary | ICD-10-CM

## 2020-07-11 DIAGNOSIS — F17200 Nicotine dependence, unspecified, uncomplicated: Secondary | ICD-10-CM | POA: Diagnosis present

## 2020-07-11 HISTORY — DX: Chronic pain syndrome: G89.4

## 2020-07-11 HISTORY — DX: Opioid use, unspecified, uncomplicated: F11.90

## 2020-07-11 HISTORY — DX: Nicotine dependence, unspecified, uncomplicated: F17.200

## 2020-07-11 HISTORY — DX: Unspecified fracture of right foot, initial encounter for open fracture: S92.901B

## 2020-07-11 LAB — COMPREHENSIVE METABOLIC PANEL
ALT: 64 U/L — ABNORMAL HIGH (ref 0–44)
AST: 88 U/L — ABNORMAL HIGH (ref 15–41)
Albumin: 2.8 g/dL — ABNORMAL LOW (ref 3.5–5.0)
Alkaline Phosphatase: 39 U/L (ref 38–126)
Anion gap: 5 (ref 5–15)
BUN: 18 mg/dL (ref 6–20)
CO2: 25 mmol/L (ref 22–32)
Calcium: 8.1 mg/dL — ABNORMAL LOW (ref 8.9–10.3)
Chloride: 107 mmol/L (ref 98–111)
Creatinine, Ser: 1.04 mg/dL (ref 0.61–1.24)
GFR, Estimated: >60 mL/min (ref >60)
Glucose, Bld: 141 mg/dL — ABNORMAL HIGH (ref 70–99)
Potassium: 4.4 mmol/L (ref 3.5–5.1)
Sodium: 137 mmol/L (ref 135–145)
Total Bilirubin: 0.3 mg/dL (ref 0.3–1.2)
Total Protein: 4.8 g/dL — ABNORMAL LOW (ref 6.5–8.1)

## 2020-07-11 LAB — RAPID URINE DRUG SCREEN, HOSP PERFORMED
Amphetamines: NOT DETECTED
Barbiturates: NOT DETECTED
Benzodiazepines: POSITIVE — AB
Cocaine: NOT DETECTED
Opiates: POSITIVE — AB
Tetrahydrocannabinol: NOT DETECTED

## 2020-07-11 LAB — CBC
HCT: 26 % — ABNORMAL LOW (ref 39.0–52.0)
HCT: 27.7 % — ABNORMAL LOW (ref 39.0–52.0)
Hemoglobin: 8.6 g/dL — ABNORMAL LOW (ref 13.0–17.0)
Hemoglobin: 8.9 g/dL — ABNORMAL LOW (ref 13.0–17.0)
MCH: 30.1 pg (ref 26.0–34.0)
MCH: 29.8 pg (ref 26.0–34.0)
MCHC: 33.1 g/dL (ref 30.0–36.0)
MCHC: 32.1 g/dL (ref 30.0–36.0)
MCV: 90.9 fL (ref 80.0–100.0)
MCV: 92.6 fL (ref 80.0–100.0)
Platelets: 132 10*3/uL — ABNORMAL LOW (ref 150–400)
Platelets: 164 10*3/uL (ref 150–400)
RBC: 2.86 MIL/uL — ABNORMAL LOW (ref 4.22–5.81)
RBC: 2.99 MIL/uL — ABNORMAL LOW (ref 4.22–5.81)
RDW: 14 % (ref 11.5–15.5)
RDW: 14.1 % (ref 11.5–15.5)
WBC: 16.4 10*3/uL — ABNORMAL HIGH (ref 4.0–10.5)
WBC: 20.8 10*3/uL — ABNORMAL HIGH (ref 4.0–10.5)
nRBC: 0 % (ref 0.0–0.2)
nRBC: 0 % (ref 0.0–0.2)

## 2020-07-11 LAB — URINALYSIS, ROUTINE W REFLEX MICROSCOPIC
Bilirubin Urine: NEGATIVE
Glucose, UA: NEGATIVE mg/dL
Hgb urine dipstick: NEGATIVE
Ketones, ur: NEGATIVE mg/dL
Leukocytes,Ua: NEGATIVE
Nitrite: NEGATIVE
Protein, ur: NEGATIVE mg/dL
Specific Gravity, Urine: 1.025 (ref 1.005–1.030)
pH: 5 (ref 5.0–8.0)

## 2020-07-11 LAB — VITAMIN D 25 HYDROXY (VIT D DEFICIENCY, FRACTURES): Vit D, 25-Hydroxy: 28.28 ng/mL — ABNORMAL LOW (ref 30–100)

## 2020-07-11 LAB — MAGNESIUM: Magnesium: 2 mg/dL (ref 1.7–2.4)

## 2020-07-11 MED ORDER — PREGABALIN 75 MG PO CAPS
75.0000 mg | ORAL_CAPSULE | Freq: Two times a day (BID) | ORAL | Status: DC
  Administered 2020-07-11 – 2020-07-15 (×8): 75 mg via ORAL
  Filled 2020-07-11 (×8): qty 1

## 2020-07-11 MED ORDER — RIVAROXABAN 10 MG PO TABS
10.0000 mg | ORAL_TABLET | Freq: Every day | ORAL | Status: DC
  Administered 2020-07-12 – 2020-07-15 (×4): 10 mg via ORAL
  Filled 2020-07-11 (×4): qty 1
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
## 2020-07-11 NOTE — Progress Notes (Signed)
Inpatient Rehabilitation Admissions Coordinator  Therapy is recommending home with [facility]. We will not pursue CIR admit. We will sign off at this time.  Danne Baxter, RN, MSN Rehab Admissions Coordinator [phone] 07/11/2020 1:54 PM

## 2020-07-11 NOTE — Progress Notes (Signed)
Orthopedic Tech Progress Note Patient Details:  Eric Snyder 15-Nov-1965 [id number] Placed order in with [facility] for [place] [facility] with [facility]  Patient ID: Eric Snyder, male   DOB: [birth year], 55 y.o.   MRN: [id number]   Eric Snyder 07/11/2020, 9:58 AM
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
## 2020-07-11 NOTE — Plan of Care (Signed)
  Problem: Education: Goal: Knowledge of General Education information will improve Description: Including pain rating scale, medication(s)/side effects and non-pharmacologic comfort measures Outcome: Progressing   Problem: Clinical Measurements: Goal: Will remain free from infection Outcome: Progressing   Problem: Activity: Goal: Risk for activity intolerance will decrease Outcome: Progressing   Problem: Nutrition: Goal: Adequate nutrition will be maintained Outcome: Progressing   Problem: Coping: Goal: Level of anxiety will decrease Outcome: Progressing   Problem: Elimination: Goal: Will not experience complications related to bowel motility Outcome: Progressing   Problem: Pain Managment: Goal: General experience of comfort will improve Outcome: Progressing   Problem: Safety: Goal: Ability to remain free from injury will improve Outcome: Progressing

## 2020-07-11 NOTE — Discharge Instructions (Addendum)
Orthopaedic Trauma Service Discharge Instructions   General Discharge Instructions   WEIGHT BEARING STATUS: Nonweightbearing right leg, use crutches or walker to mobilize  RANGE OF MOTION/ACTIVITY: Unrestricted range of motion of right knee.  Move knee is much as possible.  Only wear stump shield when moving around with walker or crutches  Bone health: Labs show some vitamin D insufficiency.  Please take vitamin D and vitamin C supplementation.  Vitamin C will also help with wound healing.  Wound Care: Change dressing again on 5/27 or 07/19/2020.  Change Mepilex dressings and then place stump shrinker back on.  Okay to clean stump shrinker in washing machine in order to clean it by hand with mild detergent.  Please call office with any questions or concerns regarding wounds  DVT/PE prophylaxis: Xarelto daily x 4 weeks   Diet: as you were eating previously.  Can use over the counter stool softeners and bowel preparations, such as Miralax, to help with bowel movements.  Narcotics can be constipating.  Be sure to drink plenty of fluids  PAIN MEDICATION USE AND EXPECTATIONS  You have likely been given narcotic medications to help control your pain.  After a traumatic event that results in an fracture (broken bone) with or without surgery, it is ok to use narcotic pain medications to help control one's pain.  We understand that everyone responds to pain differently and each individual patient will be evaluated on a regular basis for the continued need for narcotic medications. Ideally, narcotic medication use should last no more than 6-8 weeks (coinciding with fracture healing).   As a patient it is your responsibility as well to monitor narcotic medication use and report the amount and frequency you use these medications when you come to your office visit.   We would also advise that if you are using narcotic medications, you should take a dose prior to therapy to maximize you  participation.  IF YOU ARE ON NARCOTIC MEDICATIONS IT IS NOT PERMISSIBLE TO OPERATE A MOTOR VEHICLE (MOTORCYCLE/CAR/TRUCK/MOPED) OR HEAVY MACHINERY DO NOT MIX NARCOTICS WITH OTHER CNS (CENTRAL NERVOUS SYSTEM) DEPRESSANTS SUCH AS ALCOHOL   POST-OPERATIVE OPIOID TAPER INSTRUCTIONS: . It is important to wean off of your opioid medication as soon as possible. If you do not need pain medication after your surgery it is ok to stop day one. Marland Kitchen Opioids include: o Codeine, Hydrocodone(Norco, Vicodin), Oxycodone(Percocet, oxycontin) and hydromorphone amongst others.  . Long term and even short term use of opiods can cause: o Increased pain response o Dependence o Constipation o Depression o Respiratory depression o And more.  . Withdrawal symptoms can include o Flu like symptoms o Nausea, vomiting o And more . Techniques to manage these symptoms o Hydrate well o Eat regular healthy meals o Stay active o Use relaxation techniques(deep breathing, meditating, yoga) . Do Not substitute Alcohol to help with tapering . If you have been on opioids for less than two weeks and do not have pain than it is ok to stop all together.  . Plan to wean off of opioids o This plan should start within one week post op of your fracture surgery  o Maintain the same interval or time between taking each dose and first decrease the dose.  o Cut the total daily intake of opioids by one tablet each day o Next start to increase the time between doses. o The last dose that should be eliminated is the evening dose.    STOP SMOKING OR USING NICOTINE  PRODUCTS!!!!  As discussed nicotine severely impairs your body's ability to heal surgical and traumatic wounds but also impairs bone healing.  Wounds and bone heal by forming microscopic blood vessels (angiogenesis) and nicotine is a vasoconstrictor (essentially, shrinks blood vessels).  Therefore, if vasoconstriction occurs to these microscopic blood vessels they essentially  disappear and are unable to deliver necessary nutrients to the healing tissue.  This is one modifiable factor that you can do to dramatically increase your chances of healing your injury.    (This means no smoking, no nicotine gum, patches, etc)  DO NOT USE NONSTEROIDAL ANTI-INFLAMMATORY DRUGS (NSAID'S)  Using products such as Advil (ibuprofen), Aleve (naproxen), Motrin (ibuprofen) for additional pain control during fracture healing can delay and/or prevent the healing response.  If you would like to take over the counter (OTC) medication, Tylenol (acetaminophen) is ok.  However, some narcotic medications that are given for pain control contain acetaminophen as well. Therefore, you should not exceed more than 4000 mg of tylenol in a day if you do not have liver disease.  Also note that there are may OTC medicines, such as cold medicines and allergy medicines that my contain tylenol as well.  If you have any questions about medications and/or interactions please ask your doctor/PA or your pharmacist.      ICE AND ELEVATE INJURED/OPERATIVE EXTREMITY  Using ice and elevating the injured extremity above your heart can help with swelling and pain control.  Icing in a pulsatile fashion, such as 20 minutes on and 20 minutes off, can be followed.    Do not place ice directly on skin. Make sure there is a barrier between to skin and the ice pack.    Using frozen items such as frozen peas works well as the conform nicely to the are that needs to be iced.  USE AN ACE WRAP OR TED HOSE FOR SWELLING CONTROL  In addition to icing and elevation, Ace wraps or TED hose are used to help limit and resolve swelling.  It is recommended to use Ace wraps or TED hose until you are informed to stop.    When using Ace Wraps start the wrapping distally (farthest away from the body) and wrap proximally (closer to the body)   Example: If you had surgery on your leg or thing and you do not have a splint on, start the ace wrap at  the toes and work your way up to the thigh        If you had surgery on your upper extremity and do not have a splint on, start the ace wrap at your fingers and work your way up to the upper arm  IF YOU ARE IN A SPLINT OR CAST DO NOT Henry   If your splint gets wet for any reason please contact the office immediately. You may shower in your splint or cast as long as you keep it dry.  This can be done by wrapping in a cast cover or garbage back (or similar)  Do Not stick any thing down your splint or cast such as pencils, money, or hangers to try and scratch yourself with.  If you feel itchy take benadryl as prescribed on the bottle for itching  IF YOU ARE IN A CAM BOOT (BLACK BOOT)  You may remove boot periodically. Perform daily dressing changes as noted below.  Wash the liner of the boot regularly and wear a sock when wearing the boot. It is recommended  that you sleep in the boot until told otherwise    Call office for the following:  Temperature greater than 101F  Persistent nausea and vomiting  Severe uncontrolled pain  Redness, tenderness, or signs of infection (pain, swelling, redness, odor or green/yellow discharge around the site)  Difficulty breathing, headache or visual disturbances  Hives  Persistent dizziness or light-headedness  Extreme fatigue  Any other questions or concerns you may have after discharge  In an emergency, call 911 or go to an Emergency Department at a nearby hospital  HELPFUL INFORMATION  ? If you had a block, it will wear off between 8-24 hrs postop typically.  This is period when your pain may go from nearly zero to the pain you would have had postop without the block.  This is an abrupt transition but nothing dangerous is happening.  You may take an extra dose of narcotic when this happens.  ? You should wean off your narcotic medicines as soon as you are able.  Most patients will be off or using minimal narcotics before their  first postop appointment.   ? We suggest you use the pain medication the first night prior to going to bed, in order to ease any pain when the anesthesia wears off. You should avoid taking pain medications on an empty stomach as it will make you nauseous.  ? Do not drink alcoholic beverages or take illicit drugs when taking pain medications.  ? In most states it is against the law to drive while you are in a splint or sling.  And certainly against the law to drive while taking narcotics.  ? You may return to work/school in the next couple of days when you feel up to it.   ? Pain medication may make you constipated.  Below are a few solutions to try in this order: - Decrease the amount of pain medication if you aren't having pain. - Drink lots of decaffeinated fluids. - Drink prune juice and/or each dried prunes  o If the first 3 don't work start with additional solutions - Take Colace - an over-the-counter stool softener - Take Senokot - an over-the-counter laxative - Take Miralax - a stronger over-the-counter laxative     CALL THE OFFICE WITH ANY QUESTIONS OR CONCERNS: [phone]   VISIT OUR WEBSITE FOR ADDITIONAL INFORMATION: [email or web address]      Information on my medicine - XARELTO (Rivaroxaban)  This medication education was reviewed with me or my healthcare representative as part of my discharge preparation.   Why was Xarelto prescribed for you? Xarelto was prescribed for you to reduce the risk of blood clots forming after orthopedic surgery. The medical term for these abnormal blood clots is venous thromboembolism (VTE).  What do you need to know about xarelto ? Take your Xarelto ONCE DAILY at the same time every day. You may take it either with or without food.  If you have difficulty swallowing the tablet whole, you may crush it and mix in applesauce just prior to taking your dose.  Take Xarelto exactly as prescribed by your doctor and DO NOT stop taking  Xarelto without talking to the doctor who prescribed the medication.  Stopping without other VTE prevention medication to take the place of Xarelto may increase your risk of developing a clot.  After discharge, you should have regular check-up appointments with your healthcare provider that is prescribing your Xarelto.    What do you do if you miss a dose? If you miss a  dose, take it as soon as you remember on the same day then continue your regularly scheduled once daily regimen the next day. Do not take two doses of Xarelto on the same day.   Important Safety Information A possible side effect of Xarelto is bleeding. You should call your healthcare provider right away if you experience any of the following: ? Bleeding from an injury or your nose that does not stop. ? Unusual colored urine (red or dark brown) or unusual colored stools (red or black). ? Unusual bruising for unknown reasons. ? A serious fall or if you hit your head (even if there is no bleeding).  Some medicines may interact with Xarelto and might increase your risk of bleeding while on Xarelto. To help avoid this, consult your healthcare provider or pharmacist prior to using any new prescription or non-prescription medications, including herbals, vitamins, non-steroidal anti-inflammatory drugs (NSAIDs) and supplements.  This website has more information on Xarelto: [email or web address]
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
## 2020-07-12 LAB — BASIC METABOLIC PANEL
Anion gap: 7 (ref 5–15)
BUN: 16 mg/dL (ref 6–20)
CO2: 25 mmol/L (ref 22–32)
Calcium: 8.4 mg/dL — ABNORMAL LOW (ref 8.9–10.3)
Chloride: 108 mmol/L (ref 98–111)
Creatinine, Ser: 0.96 mg/dL (ref 0.61–1.24)
GFR, Estimated: >60 mL/min (ref >60)
Glucose, Bld: 110 mg/dL — ABNORMAL HIGH (ref 70–99)
Potassium: 4 mmol/L (ref 3.5–5.1)
Sodium: 140 mmol/L (ref 135–145)

## 2020-07-12 LAB — CBC
HCT: 24.8 % — ABNORMAL LOW (ref 39.0–52.0)
Hemoglobin: 8.1 g/dL — ABNORMAL LOW (ref 13.0–17.0)
MCH: 30.1 pg (ref 26.0–34.0)
MCHC: 32.7 g/dL (ref 30.0–36.0)
MCV: 92.2 fL (ref 80.0–100.0)
Platelets: 122 10*3/uL — ABNORMAL LOW (ref 150–400)
RBC: 2.69 MIL/uL — ABNORMAL LOW (ref 4.22–5.81)
RDW: 14.2 % (ref 11.5–15.5)
WBC: 8.6 10*3/uL (ref 4.0–10.5)
nRBC: 0 % (ref 0.0–0.2)

## 2020-07-12 NOTE — Progress Notes (Signed)
RN received call from pt's wife, Eric Snyder, very upset wishing to speak with the director. Explained to wife that I was the charge nurse and if I could help her. She is upset that husband has been sitting in a "wet gown" since yesterday and that it was from his IV "exploding" and pt didn't receive his pain medication or antibiotics. She was also upset that she wasn't allowed to spend the night and that the chair is also broken in the room. I explained the visitor policy and that I would go check on pt and help him with changing his gown/cleaning him up personally. Spoke with primary RN, who has been in pt's room several times with wife on the phone and these issues were not relayed to him. I went into pt's room and his gown was wet, potentially from ice pack? As looked like IV was taken out last night and not replaced this is AM. Pt assisted to chair and all linens/gown changed. With let director on call for the weekend know. Pt was pleasant and did not seem upset with speaking with me.

## 2020-07-12 NOTE — Plan of Care (Signed)
  Problem: Health Behavior/Discharge Planning: Goal: Ability to manage health-related needs will improve Outcome: Progressing   Problem: Clinical Measurements: Goal: Will remain free from infection Outcome: Progressing   Problem: Activity: Goal: Risk for activity intolerance will decrease Outcome: Progressing   
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
## 2020-07-12 NOTE — Progress Notes (Signed)
Pt's IV is leaking and was removed by this RN at this time. This was pt's only IV and he has PCA running. He stated that he does not want another IV placed at this time and is trying to "wean" himself off of PCA. I educated him that he would not be able to use PCA without an IV. He told me he would call if he felt like he needed another IV placed. PCA pump is still in pt's room at this time.

## 2020-07-12 NOTE — Progress Notes (Signed)
Subjective: 2 Days Post-Op Procedure(s) (LRB): IRRIGATION AND DEBRIDEMENT OF ANKLE with foot/ankle  AMPUTATION (Right)  Patient reports pain as mild to moderate, despite not having PCA  Denies nausea, voiding without difficulty, +flatus Objective:   VITALS:  Temp:  [98 F (36.7 C)-98.7 F (37.1 C)] 98.6 F (37 C) (05/21 0501) Pulse Rate:  [66-79] 66 (05/21 0501) Resp:  [16-20] 19 (05/21 0501) BP: (127-136)/(80-87) 131/83 (05/21 0501) SpO2:  [95 %-99 %] 99 % (05/21 0501)  Neurologically intact  Tolerating PT well Dressing clean and dry VAC active   LABS Recent Labs    07/09/20 2204 07/10/20 0722 07/11/20 0257 07/11/20 1038 07/12/20 0422  HGB 15.0 11.4* 8.6* 8.9* 8.1*  WBC  --  17.5* 16.4* 20.8* 8.6  PLT  --  185 132* 164 122*   Recent Labs    07/11/20 0257 07/12/20 0422  NA 137 140  K 4.4 4.0  CL 107 108  CO2 25 25  BUN 18 16  CREATININE 1.04 0.96  GLUCOSE 141* 110*   Recent Labs    07/09/20 2200  INR 0.9     Assessment/Plan: 2 Days Post-Op Procedure(s) (LRB): IRRIGATION AND DEBRIDEMENT OF ANKLE with foot/ankle  AMPUTATION (Right)  Discharge home with home health after PT and post op trauma goals met for wound healing D/c planning to include home health needs and DMEs Patient wishes to hold on restart of PCA at this time-may restart now that IV is restarted; please contact if any other pain med needs arise  Eric Snyder  [phone] 07/12/2020, 8:51 AM

## 2020-07-13 LAB — CBC
HCT: 24.7 % — ABNORMAL LOW (ref 39.0–52.0)
Hemoglobin: 8.2 g/dL — ABNORMAL LOW (ref 13.0–17.0)
MCH: 30.4 pg (ref 26.0–34.0)
MCHC: 33.2 g/dL (ref 30.0–36.0)
MCV: 91.5 fL (ref 80.0–100.0)
Platelets: 146 10*3/uL — ABNORMAL LOW (ref 150–400)
RBC: 2.7 MIL/uL — ABNORMAL LOW (ref 4.22–5.81)
RDW: 14.1 % (ref 11.5–15.5)
WBC: 9.9 10*3/uL (ref 4.0–10.5)
nRBC: 0 % (ref 0.0–0.2)
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
## 2020-07-13 NOTE — Plan of Care (Signed)
°  Problem: Clinical Measurements: °Goal: Will remain free from infection °Outcome: Progressing °  °Problem: Activity: °Goal: Risk for activity intolerance will decrease °Outcome: Progressing °  °Problem: Nutrition: °Goal: Adequate nutrition will be maintained °Outcome: Progressing °  °

## 2020-07-13 NOTE — Progress Notes (Addendum)
    Subjective: Patient reports pain as moderate.  Tolerating diet.  Urinating.  +Flatus.  No CP, SOB.   OOB  With PT  Pain in left foot improving Patient complains of mild altered sense of balance when first arising , but only lasts for a few seconds no N,V  Objective:   VITALS:   Vitals:   07/13/20 0049 07/13/20 0400 07/13/20 0429 07/13/20 0912  BP:   122/78 117/64  Pulse:   62 65  Resp: 16 16 16 15   Temp:   98.3 F (36.8 C) 98.6 F (37 C)  TempSrc:   Oral Oral  SpO2: 95% 95% 95% 96%  Weight:      Height:       CBC Latest Ref Rng & Units 07/13/2020 07/12/2020 07/11/2020  WBC 4.0 - 10.5 K/uL 9.9 8.6 20.8(H)  Hemoglobin 13.0 - 17.0 g/dL 8.2(L) 8.1(L) 8.9(L)  Hematocrit 39.0 - 52.0 % 24.7(L) 24.8(L) 27.7(L)  Platelets 150 - 400 K/uL 146(L) 122(L) 164   BMP Latest Ref Rng & Units 07/12/2020 07/11/2020 07/10/2020  Glucose 70 - 99 mg/dL 110(H) 141(H) 193(H)  BUN 6 - 20 mg/dL 16 18 17   Creatinine 0.61 - 1.24 mg/dL 0.96 1.04 1.15  Sodium 135 - 145 mmol/L 140 137 136  Potassium 3.5 - 5.1 mmol/L 4.0 4.4 4.0  Chloride 98 - 111 mmol/L 108 107 108  CO2 22 - 32 mmol/L 25 25 20(L)  Calcium 8.9 - 10.3 mg/dL 8.4(L) 8.1(L) 8.4(L)   Intake/Output      05/21 0701 05/22 0700 05/22 0701 05/23 0700   P.O. 360    I.V. (mL/kg)     Total Intake(mL/kg) 360 (4.2)    Urine (mL/kg/hr) 950 (0.5)    Drains 50    Total Output 1000    Net -640         Urine Occurrence 2 x       Physical Exam: General: NAD.   Resp: No increased wob Cardio: regular rate and rhythm ABD soft Neurologically intact MSK Neurovascularly intact left LE Dorsiflexion/Plantar flexion intact Incision: dressing C/D/I ABD soft Compartment soft vac in place  Assessment: 3 Days Post-Op  S/P Procedure(s) (LRB): IRRIGATION AND DEBRIDEMENT OF ANKLE with foot/ankle  AMPUTATION (Right) by Dr. Ernesta Amble. Percell Miller on 5/19  Principal Problem:   Fracture of right tibia and fibula, open type III, initial encounter Active  Problems:   Nicotine dependence   Chronic pain syndrome   Chronic, continuous use of opioids   Multiple open fractures of right foot  ADDITIONAL DIAGNOSIS:   Acute Blood Loss Anemia and Hypertension  Plan: Continue VAC until tomorrow  Up with therapy Incentive Spirometry Elevate and Apply ice  Weightbearing: WBAT LLE Insicional and dressing care: Dressings left intact until follow-up Orthopedic device(s): None and Wound WUX:LKGMW BKA Showering: Keep dressing dry VTE prophylaxis:  as per trauma service, SCDs, ambulation Pain control: patient deferred on restart of PCA yesterday and is managing pain with prn meds Follow - up plan: PRN with Dr Marcelino Scot  Dispo: patient and family concerned about his discharge plans- home situation seems to be changing, and SNF or CIR may be more appropriate if home support is lacking       BLAIR Deborah Dondero, PA-C 07/13/2020, 3:02 PM

## 2020-07-14 LAB — SURGICAL PATHOLOGY
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
## 2020-07-15 ENCOUNTER — Encounter (INDEPENDENT_AMBULATORY_CARE_PROVIDER_SITE_OTHER): Payer: Self-pay | Admitting: Physician Assistant

## 2020-07-15 ENCOUNTER — Other Ambulatory Visit (HOSPITAL_COMMUNITY): Payer: Self-pay

## 2020-07-15 LAB — CBC
HCT: 27.9 % — ABNORMAL LOW (ref 39.0–52.0)
Hemoglobin: 9.2 g/dL — ABNORMAL LOW (ref 13.0–17.0)
MCH: 30.1 pg (ref 26.0–34.0)
MCHC: 33 g/dL (ref 30.0–36.0)
MCV: 91.2 fL (ref 80.0–100.0)
Platelets: 244 10*3/uL (ref 150–400)
RBC: 3.06 MIL/uL — ABNORMAL LOW (ref 4.22–5.81)
RDW: 14.1 % (ref 11.5–15.5)
WBC: 11.8 10*3/uL — ABNORMAL HIGH (ref 4.0–10.5)
nRBC: 0 % (ref 0.0–0.2)

## 2020-07-15 MED ORDER — RIVAROXABAN 10 MG PO TABS
10.0000 mg | ORAL_TABLET | Freq: Every day | ORAL | 0 refills | Status: DC
  Filled 2020-07-15: qty 30, 30d supply, fill #0

## 2020-07-15 MED ORDER — ASCORBIC ACID 1000 MG PO TABS
1000.0000 mg | ORAL_TABLET | Freq: Every day | ORAL | 2 refills | Status: AC
  Filled 2020-07-15: qty 30, 30d supply, fill #0

## 2020-07-15 MED ORDER — RIVAROXABAN 15 MG PO TABS
15.0000 mg | ORAL_TABLET | Freq: Every day | ORAL | 0 refills | Status: DC
  Filled 2020-07-15: qty 30, 30d supply, fill #0

## 2020-07-15 MED ORDER — DOCUSATE SODIUM 100 MG PO CAPS
100.0000 mg | ORAL_CAPSULE | Freq: Two times a day (BID) | ORAL | 0 refills | Status: DC
  Filled 2020-07-15: qty 10, 5d supply, fill #0

## 2020-07-15 MED ORDER — CHOLECALCIFEROL 125 MCG (5000 UT) PO TABS
1.0000 | ORAL_TABLET | Freq: Every day | ORAL | 6 refills | Status: DC
  Filled 2020-07-15: qty 30, 30d supply, fill #0

## 2020-07-15 MED ORDER — METHOCARBAMOL 500 MG PO TABS
500.0000 mg | ORAL_TABLET | Freq: Three times a day (TID) | ORAL | 0 refills | Status: DC | PRN
  Filled 2020-07-15: qty 60, 10d supply, fill #0

## 2020-07-15 MED ORDER — PREGABALIN 75 MG PO CAPS
75.0000 mg | ORAL_CAPSULE | Freq: Two times a day (BID) | ORAL | 1 refills | Status: DC
  Filled 2020-07-15: qty 60, 30d supply, fill #0

## 2020-07-15 MED ORDER — ACETAMINOPHEN 500 MG PO TABS
1000.0000 mg | ORAL_TABLET | Freq: Three times a day (TID) | ORAL | 0 refills | Status: DC
  Filled 2020-07-15: qty 90, 15d supply, fill #0

## 2020-07-15 NOTE — Plan of Care (Signed)

## 2020-07-15 NOTE — Plan of Care (Signed)
  Problem: Education: Goal: Knowledge of General Education information will improve Description: Including pain rating scale, medication(s)/side effects and non-pharmacologic comfort measures 07/15/2020 1359 by Camillia Herter, RN Outcome: Adequate for Discharge 07/15/2020 1118 by Camillia Herter, RN Outcome: Progressing   Problem: Health Behavior/Discharge Planning: Goal: Ability to manage health-related needs will improve 07/15/2020 1359 by Camillia Herter, RN Outcome: Adequate for Discharge 07/15/2020 1118 by Julius Bowels A, RN Outcome: Progressing   Problem: Clinical Measurements: Goal: Ability to maintain clinical measurements within normal limits will improve 07/15/2020 1359 by Camillia Herter, RN Outcome: Adequate for Discharge 07/15/2020 1118 by Julius Bowels A, RN Outcome: Progressing Goal: Will remain free from infection 07/15/2020 1359 by Camillia Herter, RN Outcome: Adequate for Discharge 07/15/2020 1118 by Camillia Herter, RN Outcome: Progressing Goal: Diagnostic test results will improve 07/15/2020 1359 by Camillia Herter, RN Outcome: Adequate for Discharge 07/15/2020 1118 by Julius Bowels A, RN Outcome: Progressing Goal: Respiratory complications will improve 07/15/2020 1359 by Camillia Herter, RN Outcome: Adequate for Discharge 07/15/2020 1118 by Julius Bowels A, RN Outcome: Progressing Goal: Cardiovascular complication will be avoided 07/15/2020 1359 by Camillia Herter, RN Outcome: Adequate for Discharge 07/15/2020 1118 by Camillia Herter, RN Outcome: Progressing   Problem: Activity: Goal: Risk for activity intolerance will decrease 07/15/2020 1359 by Camillia Herter, RN Outcome: Adequate for Discharge 07/15/2020 1118 by Julius Bowels A, RN Outcome: Progressing   Problem: Nutrition: Goal: Adequate nutrition will be maintained 07/15/2020 1359 by Camillia Herter, RN Outcome: Adequate for Discharge 07/15/2020 1118 by Camillia Herter, RN Outcome:  Progressing   Problem: Coping: Goal: Level of anxiety will decrease 07/15/2020 1359 by Camillia Herter, RN Outcome: Adequate for Discharge 07/15/2020 1118 by Julius Bowels A, RN Outcome: Progressing   Problem: Elimination: Goal: Will not experience complications related to bowel motility 07/15/2020 1359 by Camillia Herter, RN Outcome: Adequate for Discharge 07/15/2020 1118 by Julius Bowels A, RN Outcome: Progressing Goal: Will not experience complications related to urinary retention 07/15/2020 1359 by Camillia Herter, RN Outcome: Adequate for Discharge 07/15/2020 1118 by Camillia Herter, RN Outcome: Progressing   Problem: Pain Managment: Goal: General experience of comfort will improve 07/15/2020 1359 by Camillia Herter, RN Outcome: Adequate for Discharge 07/15/2020 1118 by Julius Bowels A, RN Outcome: Progressing   Problem: Safety: Goal: Ability to remain free from injury will improve 07/15/2020 1359 by Camillia Herter, RN Outcome: Adequate for Discharge 07/15/2020 1118 by Julius Bowels A, RN Outcome: Progressing   Problem: Skin Integrity: Goal: Risk for impaired skin integrity will decrease 07/15/2020 1359 by Camillia Herter, RN Outcome: Adequate for Discharge 07/15/2020 1118 by Camillia Herter, RN Outcome: Progressing
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
## 2020-07-15 NOTE — Progress Notes (Signed)
Eric Snyder to be D/C'd  per MD order. Discussed with the patient and all questions fully answered.  VSS, Skin clean, dry and intact without evidence of skin break down, no evidence of skin tears noted.  IV catheter discontinued intact. Site without signs and symptoms of complications. Dressing and pressure applied.  An After Visit Summary was printed and given to the patient. Patient received prescription.  D/c education completed with patient/family including follow up instructions, medication list, d/c activities limitations if indicated, with other d/c instructions as indicated by MD - patient able to verbalize understanding, all questions fully answered.   Patient instructed to return to ED, call 911, or call MD for any changes in condition.   Patient to be escorted via [place], and D/C home via private auto.
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
## 2020-07-22 ENCOUNTER — Other Ambulatory Visit (HOSPITAL_COMMUNITY): Payer: Self-pay

## 2020-07-22 ENCOUNTER — Telehealth (HOSPITAL_COMMUNITY): Payer: Self-pay | Admitting: Pharmacist

## 2020-07-22 NOTE — Telephone Encounter (Signed)
Unable to reach patient.

## 2020-07-22 NOTE — Telephone Encounter (Signed)
Pharmacy Transitions of Care Follow-up Telephone Call  Date of discharge: 07/15/2020   How have you been since you were released from the hospital? Good, some issues with foot pain. Medication changes made at discharge:  - START: Tylenol, Vit C, docusate, methocarbamol, pregabalin, vit. D, xarelto  Medication changes verified by the patient?Yes   Medication Accessibility:  Was the patient provided with refills on discharged medications? No    Medication Review:  RIVAROXABAN (XARELTO)  Rivaroxaban 15 mg BID initiated on 07/16/20.  - Discussed importance of taking medication with food and around the same time everyday  - Reviewed potential DDIs with patient  - Advised patient of medications to avoid (NSAIDs, ASA)  - Educated that Tylenol (acetaminophen) will be the preferred analgesic to prevent risk of bleeding  - Emphasized importance of monitoring for signs and symptoms of bleeding (abnormal bruising, prolonged bleeding, nose bleeds, bleeding from gums, discolored urine, black tarry stools)  - Advised patient to alert all providers of anticoagulation therapy prior to starting a new medication or having a procedure    Follow-up Appointments:   [facility] f/u appt confirmed? June 1  If their condition worsens, is the pt aware to call PCP or go to the Emergency Dept.? Yes Final Patient Assessment: Patient reports he is doing well since being home but does complain of some foot pain that he plans on addressing with Dr. Marcelino Scot tomorrow.  Reviewed all new meds and possible side effects.

## 2020-07-30 ENCOUNTER — Encounter (HOSPITAL_COMMUNITY): Payer: Self-pay | Admitting: Orthopedic Surgery

## 2020-07-30 DIAGNOSIS — E559 Vitamin D deficiency, unspecified: Secondary | ICD-10-CM

## 2020-07-30 HISTORY — DX: Vitamin D deficiency, unspecified: E55.9

## 2020-08-06 DIAGNOSIS — S82201F Unspecified fracture of shaft of right tibia, subsequent encounter for open fracture type IIIA, IIIB, or IIIC with routine healing: Secondary | ICD-10-CM | POA: Diagnosis not present

## 2020-08-06 DIAGNOSIS — M25572 Pain in left ankle and joints of left foot: Secondary | ICD-10-CM | POA: Diagnosis not present

## 2020-08-06 DIAGNOSIS — S82401F Unspecified fracture of shaft of right fibula, subsequent encounter for open fracture type IIIA, IIIB, or IIIC with routine healing: Secondary | ICD-10-CM | POA: Diagnosis not present

## 2020-08-07 DIAGNOSIS — S93402A Sprain of unspecified ligament of left ankle, initial encounter: Secondary | ICD-10-CM | POA: Diagnosis not present

## 2020-08-14 DIAGNOSIS — G894 Chronic pain syndrome: Secondary | ICD-10-CM | POA: Diagnosis not present

## 2020-08-14 DIAGNOSIS — S92901B Unspecified fracture of right foot, initial encounter for open fracture: Secondary | ICD-10-CM | POA: Diagnosis not present

## 2020-08-14 DIAGNOSIS — S82201C Unspecified fracture of shaft of right tibia, initial encounter for open fracture type IIIA, IIIB, or IIIC: Secondary | ICD-10-CM | POA: Diagnosis not present

## 2020-09-22 DIAGNOSIS — G8929 Other chronic pain: Secondary | ICD-10-CM | POA: Diagnosis not present

## 2020-09-24 DIAGNOSIS — Z89511 Acquired absence of right leg below knee: Secondary | ICD-10-CM | POA: Diagnosis not present

## 2020-10-08 DIAGNOSIS — M5416 Radiculopathy, lumbar region: Secondary | ICD-10-CM | POA: Diagnosis not present

## 2020-10-08 DIAGNOSIS — M4807 Spinal stenosis, lumbosacral region: Secondary | ICD-10-CM | POA: Diagnosis not present

## 2020-10-22 DIAGNOSIS — S82401F Unspecified fracture of shaft of right fibula, subsequent encounter for open fracture type IIIA, IIIB, or IIIC with routine healing: Secondary | ICD-10-CM | POA: Diagnosis not present

## 2020-10-22 DIAGNOSIS — S82201F Unspecified fracture of shaft of right tibia, subsequent encounter for open fracture type IIIA, IIIB, or IIIC with routine healing: Secondary | ICD-10-CM | POA: Diagnosis not present

## 2020-10-22 DIAGNOSIS — Z89511 Acquired absence of right leg below knee: Secondary | ICD-10-CM | POA: Diagnosis not present

## 2020-10-30 ENCOUNTER — Other Ambulatory Visit: Payer: Self-pay | Admitting: Orthopedic Surgery

## 2020-10-30 DIAGNOSIS — M5416 Radiculopathy, lumbar region: Secondary | ICD-10-CM

## 2020-10-30 DIAGNOSIS — M4807 Spinal stenosis, lumbosacral region: Secondary | ICD-10-CM

## 2020-11-03 ENCOUNTER — Ambulatory Visit
Admission: RE | Admit: 2020-11-03 | Discharge: 2020-11-03 | Disposition: A | Discharge status: Home / Self Care | Payer: BC Managed Care – PPO | Source: Ambulatory Visit | Attending: Orthopedic Surgery | Admitting: Orthopedic Surgery

## 2020-11-03 DIAGNOSIS — M545 Low back pain, unspecified: Secondary | ICD-10-CM | POA: Diagnosis not present

## 2020-11-03 DIAGNOSIS — M4807 Spinal stenosis, lumbosacral region: Secondary | ICD-10-CM

## 2020-11-03 DIAGNOSIS — M5416 Radiculopathy, lumbar region: Secondary | ICD-10-CM

## 2020-11-19 DIAGNOSIS — M7541 Impingement syndrome of right shoulder: Secondary | ICD-10-CM | POA: Diagnosis not present

## 2020-11-19 DIAGNOSIS — S82401F Unspecified fracture of shaft of right fibula, subsequent encounter for open fracture type IIIA, IIIB, or IIIC with routine healing: Secondary | ICD-10-CM | POA: Diagnosis not present

## 2020-11-19 DIAGNOSIS — S82201F Unspecified fracture of shaft of right tibia, subsequent encounter for open fracture type IIIA, IIIB, or IIIC with routine healing: Secondary | ICD-10-CM | POA: Diagnosis not present

## 2020-12-18 DIAGNOSIS — Z89511 Acquired absence of right leg below knee: Secondary | ICD-10-CM | POA: Diagnosis not present

## 2020-12-30 ENCOUNTER — Encounter: Payer: Self-pay | Admitting: Physical Therapy

## 2020-12-30 ENCOUNTER — Ambulatory Visit (INDEPENDENT_AMBULATORY_CARE_PROVIDER_SITE_OTHER): Payer: BC Managed Care – PPO | Admitting: Physical Therapy

## 2020-12-30 ENCOUNTER — Other Ambulatory Visit: Payer: Self-pay

## 2020-12-30 DIAGNOSIS — M25661 Stiffness of right knee, not elsewhere classified: Secondary | ICD-10-CM | POA: Diagnosis not present

## 2020-12-30 DIAGNOSIS — M6281 Muscle weakness (generalized): Secondary | ICD-10-CM | POA: Diagnosis not present

## 2020-12-30 DIAGNOSIS — Z9181 History of falling: Secondary | ICD-10-CM

## 2020-12-30 DIAGNOSIS — R2689 Other abnormalities of gait and mobility: Secondary | ICD-10-CM | POA: Diagnosis not present

## 2020-12-30 DIAGNOSIS — M79661 Pain in right lower leg: Secondary | ICD-10-CM

## 2020-12-30 DIAGNOSIS — R293 Abnormal posture: Secondary | ICD-10-CM

## 2020-12-30 DIAGNOSIS — R2681 Unsteadiness on feet: Secondary | ICD-10-CM
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
## 2020-12-31 ENCOUNTER — Ambulatory Visit (INDEPENDENT_AMBULATORY_CARE_PROVIDER_SITE_OTHER): Payer: BC Managed Care – PPO | Admitting: Physical Therapy

## 2020-12-31 ENCOUNTER — Encounter: Payer: Self-pay | Admitting: Physical Therapy

## 2020-12-31 DIAGNOSIS — R2689 Other abnormalities of gait and mobility: Secondary | ICD-10-CM

## 2020-12-31 DIAGNOSIS — M25661 Stiffness of right knee, not elsewhere classified: Secondary | ICD-10-CM

## 2020-12-31 DIAGNOSIS — R293 Abnormal posture: Secondary | ICD-10-CM

## 2020-12-31 DIAGNOSIS — M79661 Pain in right lower leg: Secondary | ICD-10-CM

## 2020-12-31 DIAGNOSIS — Z9181 History of falling: Secondary | ICD-10-CM

## 2020-12-31 DIAGNOSIS — M6281 Muscle weakness (generalized): Secondary | ICD-10-CM

## 2020-12-31 DIAGNOSIS — R2681 Unsteadiness on feet: Secondary | ICD-10-CM | POA: Diagnosis not present

## 2020-12-31 NOTE — Therapy (Signed)
[facility] Physical Therapy [address] [place], [place], [address] Phone: [phone]   Fax:  [phone]  Physical Therapy Treatment  Patient Details  Name: Eric Snyder MRN: [id number] Date of Birth: 1965-08-12 Referring Provider (PT): Altamese [place], MD   Encounter Date: 12/31/2020   PT End of Session - 12/31/20 1112     Visit Number 2    Number of Visits 16    Date for PT Re-Evaluation 02/19/21    Authorization Type [employer] Comm    Authorization Time Period OOP max met, 30 visit limit per calendar year    PT Start Time 1027    PT Stop Time 1106    PT Time Calculation (min) 39 min    Equipment Utilized During Treatment Gait belt    Activity Tolerance Patient tolerated treatment well;Patient limited by pain    Behavior During Therapy [facility] for tasks assessed/performed             Past Medical History:  Diagnosis Date   Chronic pain syndrome 07/11/2020   Chronic, continuous use of opioids 07/11/2020   Medical history non-contributory    Multiple open fractures of right foot 07/11/2020   Nicotine dependence 07/11/2020   Vitamin D insufficiency 07/30/2020    Past Surgical History:  Procedure Laterality Date   AMPUTATION Right 07/10/2020   Procedure: IRRIGATION AND DEBRIDEMENT OF ANKLE with foot/ankle  AMPUTATION;  Surgeon: Altamese [place], MD;  Location: [place];  Service: Orthopedics;  Laterality: Right;   EXTERNAL FIXATION LEG Right 07/09/2020   Procedure: EXTERNAL FIXATION RIGHT TIB/FIB   Wash out;  Surgeon: Renette Butters, MD;  Location: [place];  Service: Orthopedics;  Laterality: Right;   HERNIA REPAIR     INGUINAL HERNIA REPAIR Bilateral 10/31/2014   Procedure: LAPAROSCOPIC INGUINAL HERNIA with mesh ;  Surgeon: Clayburn Pert, MD;  Location: [facility];  Service: General;  Laterality: Bilateral;   NO PAST SURGERIES      There were no vitals filed for this visit.   Subjective Assessment - 12/31/20 1028     Subjective He did not wear prosthesis any  more yesterday after PT. His limb is swollen & unable to get pin to engage.  His limb was sore after yesterday but not more than he expected.    Pertinent History lumbar back surgery L3-S1 in 2017    Patient Stated Goals to use prosthesis to walk in community,    Currently in Pain? Yes    Pain Score 4     Pain Location Leg   residual limb   Pain Orientation Right;Distal;Anterior    Pain Descriptors / Indicators Sharp    Pain Type Acute pain    Pain Onset More than a month ago    Pain Frequency Intermittent    Aggravating Factors  pressure on limb with prosthesis or pressing on limb    Pain Relieving Factors taking pressure off limb                               [facility] Adult PT Treatment/Exercise - 12/31/20 1027       Transfers   Transfers Sit to Stand;Stand to Sit    Sit to Stand 5: Supervision;With upper extremity assist;With armrests;From chair/3-in-1;Other (comment)   to RW   Stand to Sit 5: Supervision;With upper extremity assist;With armrests;To chair/3-in-1;Other (comment)   from RW     Ambulation/Gait   Ambulation/Gait Yes    Ambulation/Gait Assistance 5:  Supervision    Ambulation/Gait Assistance Details verbal cues on upright posture & wt shift over prosthesis in stance    Ambulation Distance (Feet) 100 Feet    Assistive device Rolling walker;Prosthesis    Ambulation Surface Level;Indoor      Neuro Re-ed    Neuro Re-ed Details  HEP at sink working on standing balance, standing tolerance/endurance & proprioception with residual limb on RLE      Prosthetics   Prosthetic Care Comments  PT reviewed donning.  PT reviewed how to determine if correct ply socks.  PT instructed in use of shortened Vivewear under liner for wound healing.    Current prosthetic wear tolerance (days/week)  4 of 13 days since delivery    Current prosthetic wear tolerance (#hours/day)  try 1hr for 4x/day with >/= 2hrs bw wears    Current prosthetic weight-bearing tolerance  (hours/day)  Patient reported that residual limb pain did not increase with partial weight bearing activities during PT eval.  He stood up to 5 minutes followed by seated rest then gait for 5 min.    Edema pitting [place]f circumferential 37cm    Residual limb condition  1.4cm scab on medial scar, pain distal laterally & medially, +tenil test over peroneal nerve (possible neuroma), dry skin, good hair growth, cylinderical shape, mild heat rash present,    Education Provided Prosthetic cleaning;Correct ply sock adjustment;Proper Donning;Proper wear schedule/adjustment;Other (comment);Skin check   see prosthetic care comments   Person(s) Educated Patient    Education Method Explanation;Verbal cues    Education Method Verbalized understanding;Verbal cues required;Needs further instruction    Donning Prosthesis Minimal assist;Supervision                     PT Education - 12/31/20 1109     Education Details HEP at sink - see pt instructions    Person(s) Educated Patient    Methods Explanation;Demonstration;Tactile cues;Verbal cues;Handout    Comprehension Verbalized understanding;Returned demonstration;Verbal cues required;Tactile cues required;Need further instruction              PT Short Term Goals - 12/30/20 1702       PT SHORT TERM GOAL #1   Title Patient donnes prosthesis modified independent & verbalizes proper cleaning.    Time 4    Period Weeks    Status New    Target Date 01/22/21      PT SHORT TERM GOAL #2   Title Patient tolerates prosthesis >10 hrs total /day without increase in skin issues or limb pain >5/10 after standing.    Time 4    Period Weeks    Status New    Target Date 01/22/21      PT SHORT TERM GOAL #3   Title Patient able to reach 7" and look over both shoulders without UE support with supervision.    Time 4    Period Weeks    Status New    Target Date 01/22/21      PT SHORT TERM GOAL #4   Title Patient ambulates 200' with RW &  prosthesis with supervision.    Time 4    Period Weeks    Status New    Target Date 01/22/21      PT SHORT TERM GOAL #5   Title .Patient negotiates ramps & curbs with RW & prosthesis with supervision.    Time 4    Period Weeks    Status New    Target Date 01/22/21  PT Long Term Goals - 12/30/20 1700       PT LONG TERM GOAL #1   Title Patient demonstrates & verbalized understanding of prosthetic care to enable safe utilization of prosthesis.    Time 8    Period Weeks    Status New    Target Date 02/19/21      PT LONG TERM GOAL #2   Title Patient tolerates prosthesis wear for >90% of awake hours without skin issues & limb pain </= 2/10.    Time 8    Period Weeks    Status New    Target Date 02/19/21      PT LONG TERM GOAL #3   Title Berg Balance >/= 45/56 to indicate lower fall risk    Time 8    Period Weeks    Status New    Target Date 02/19/21      PT LONG TERM GOAL #4   Title Patient ambulates 300' with LRAD & prosthesis modified independent    Time 8    Period Weeks    Status New    Target Date 02/19/21      PT LONG TERM GOAL #5   Title Patient negotiates ramps, curbs & stairs with LRAD & prosthesis modified independent.    Time 8    Period Weeks    Status New    Target Date 02/19/21                   Plan - 12/31/20 1112     Clinical Impression Statement PT introduced HEP for standing balance, endurance & proprioception which he appears to understand.  PT reviewed donning which he improved.    Personal Factors and Comorbidities Time since onset of injury/illness/exacerbation    Examination-Activity Limitations Carry;Lift;Locomotion Level;Reach Overhead;Squat;Stand;Transfers    Examination-Participation Restrictions Community Activity;Occupation    Stability/Clinical Decision Making Evolving/Moderate complexity    Rehab Potential Good    PT Frequency 2x / week    PT Duration 8 weeks    PT Treatment/Interventions ADLs/Self  Care Home Management;DME Instruction;Gait training;Stair training;Functional mobility training;Therapeutic activities;Therapeutic exercise;Balance training;Neuromuscular re-education;Patient/family education;Prosthetic Training;Scar mobilization;Manual techniques;Vestibular    PT Next Visit Plan review prosthetic care, verbally check HEP at sink, prosthetic gait with RW insruct in negotiating ramps & curbs    Consulted and Agree with Plan of Care Patient             Patient will benefit from skilled therapeutic intervention in order to improve the following deficits and impairments:  Abnormal gait, Decreased activity tolerance, Decreased balance, Decreased endurance, Decreased knowledge of use of DME, Decreased mobility, Decreased range of motion, Decreased scar mobility, Decreased strength, Decreased skin integrity, Increased edema, Impaired flexibility, Postural dysfunction, Prosthetic Dependency, Pain  Visit Diagnosis: Unsteadiness on feet  Other abnormalities of gait and mobility  Muscle weakness (generalized)  History of falling  Stiffness of right knee, not elsewhere classified  Abnormal posture  Pain in right lower leg     Problem List Patient Active Problem List   Diagnosis Date Noted   Vitamin D insufficiency 07/30/2020   Nicotine dependence 07/11/2020   Chronic pain syndrome 07/11/2020   Chronic, continuous use of opioids 07/11/2020   Multiple open fractures of right foot 07/11/2020   Fracture of right tibia and fibula, open type III, initial encounter 07/10/2020    Jamey Reas, PT, DPT 12/31/2020, 11:14 AM  [facility] [address] [place], [place], [address] Phone: [phone]   Fax:  [phone]  Name: Eric Snyder MRN: [id number] Date of Birth: Oct 12, 1965

## 2020-12-31 NOTE — Patient Instructions (Signed)

## 2021-01-06 ENCOUNTER — Ambulatory Visit (INDEPENDENT_AMBULATORY_CARE_PROVIDER_SITE_OTHER): Payer: BC Managed Care – PPO | Admitting: Physical Therapy

## 2021-01-06 ENCOUNTER — Other Ambulatory Visit: Payer: Self-pay

## 2021-01-06 ENCOUNTER — Encounter: Payer: Self-pay | Admitting: Physical Therapy

## 2021-01-06 DIAGNOSIS — R293 Abnormal posture: Secondary | ICD-10-CM

## 2021-01-06 DIAGNOSIS — M6281 Muscle weakness (generalized): Secondary | ICD-10-CM | POA: Diagnosis not present

## 2021-01-06 DIAGNOSIS — M79661 Pain in right lower leg: Secondary | ICD-10-CM

## 2021-01-06 DIAGNOSIS — R2689 Other abnormalities of gait and mobility: Secondary | ICD-10-CM

## 2021-01-06 DIAGNOSIS — R2681 Unsteadiness on feet: Secondary | ICD-10-CM

## 2021-01-06 NOTE — Therapy (Signed)
[facility] Physical Therapy [address] [place], [place], [address] Phone: [phone]   Fax:  [phone]  Physical Therapy Treatment  Patient Details  Name: Eric Snyder MRN: [id number] Date of Birth: 01/26/1966 Referring Provider (PT): Altamese [place], MD   Encounter Date: 01/06/2021   PT End of Session - 01/06/21 1309     Visit Number 3    Number of Visits 16    Date for PT Re-Evaluation 02/19/21    Authorization Type [employer]    Authorization Time Period OOP max met, 30 visit limit per calendar year    PT Start Time 1145    PT Stop Time 1229    PT Time Calculation (min) 44 min    Equipment Utilized During Treatment Gait belt    Activity Tolerance Patient tolerated treatment well;Patient limited by pain    Behavior During Therapy [facility] for tasks assessed/performed             Past Medical History:  Diagnosis Date   Chronic pain syndrome 07/11/2020   Chronic, continuous use of opioids 07/11/2020   Medical history non-contributory    Multiple open fractures of right foot 07/11/2020   Nicotine dependence 07/11/2020   Vitamin D insufficiency 07/30/2020    Past Surgical History:  Procedure Laterality Date   AMPUTATION Right 07/10/2020   Procedure: IRRIGATION AND DEBRIDEMENT OF ANKLE with foot/ankle  AMPUTATION;  Surgeon: Altamese Nash, MD;  Location: [place];  Service: Orthopedics;  Laterality: Right;   EXTERNAL FIXATION LEG Right 07/09/2020   Procedure: EXTERNAL FIXATION RIGHT TIB/FIB   Wash out;  Surgeon: Renette Butters, MD;  Location: [place];  Service: Orthopedics;  Laterality: Right;   HERNIA REPAIR     INGUINAL HERNIA REPAIR Bilateral 10/31/2014   Procedure: LAPAROSCOPIC INGUINAL HERNIA with mesh ;  Surgeon: Clayburn Pert, MD;  Location: [facility];  Service: General;  Laterality: Bilateral;   NO PAST SURGERIES      There were no vitals filed for this visit.   Subjective Assessment - 01/06/21 1145     Subjective He has been wearing  prosthesis 1 hr 2x/day. No falls.    Pertinent History lumbar back surgery L3-S1 in 2017    Patient Stated Goals to use prosthesis to walk in community,    Currently in Pain? Yes    Pain Score 3    lowest 0/10 with prosthesis off, 2/10 with prosthesis and highest 5/10   Pain Location Leg   residual limb   Pain Orientation Right    Pain Descriptors / Indicators Sore;Sharp    Pain Type Other (Comment)   prosthetic   Pain Onset More than a month ago    Pain Frequency Intermittent    Aggravating Factors  pressure on prosthesis    Pain Relieving Factors taking pressure off prosthesis                               [facility] Adult PT Treatment/Exercise - 01/06/21 1145       Transfers   Transfers Sit to Stand;Stand to Sit    Sit to Stand 5: Supervision;With upper extremity assist;With armrests;From chair/3-in-1;Other (comment)   to axillary crutches   Stand to Sit 5: Supervision;With upper extremity assist;With armrests;To chair/3-in-1;Other (comment)   from axillary crutches     Ambulation/Gait   Ambulation/Gait Yes    Ambulation/Gait Assistance 5: Supervision    Ambulation/Gait Assistance Details PT adjusted pt's crutches to max height.  His are std adult and needs tall adult.  PT demo & verbal cues on wt shift over prosthesis in stance and upright posture.    Ambulation Distance (Feet) 150 Feet    Assistive device Rolling walker;Prosthesis;Crutches    Ambulation Surface Level;Indoor    Stairs Yes    Stairs Assistance Details (indicate cue type and reason) PT demo & verbal cues on technique with single rail & crutch step to pattern.  Pt verbalized understanding.    Ramp 5: Supervision   axillary crutches & TTA prosthesis   Ramp Details (indicate cue type and reason) PT demo, verbal & tactile cues on technique    Curb 5: Supervision   axillary crutches & TTA prosthesis   Curb Details (indicate cue type and reason) PT demo, verbal & tactile cues on technique       Self-Care   Self-Care ADL's    ADL's sitting on 24" bar stool with feet on floor for ADLs and increasing upright turnk without UE support.      Exercises   Exercises Knee/Hip;Other Exercises      Knee/Hip Exercises: Seated   Sit to Sand 1 set;10 reps;without UE support;Other (comment)   from 24" bar stool, demo, verbal & tactile cues     Prosthetics   Prosthetic Care Comments  increase wear to 2 hrs 2-3x/day    Current prosthetic wear tolerance (days/week)  daily    Current prosthetic wear tolerance (#hours/day)  2 hrs 2x/day    Current prosthetic weight-bearing tolerance (hours/day)  reports no increase in limb pain with standing activities    Edema pitting midcalf circumferential 37cm    Education Provided Proper Donning;Proper wear schedule/adjustment;Other (comment)   see prosthetic care comments   Person(s) Educated Patient    Education Method Explanation;Verbal cues    Education Method Verbalized understanding;Verbal cues required;Needs further instruction                 Balance Exercises - 01/06/21 1145       Balance Exercises: Standing   Standing Eyes Opened Solid surface;Head turns;5 reps   initially solid ground, progressed to foam pad, head movements 4 directions   Standing Eyes Opened Limitations tactile, verbal & visual (mirror) for equal weight bearing    Standing Eyes Closed Head turns;Solid surface;5 reps    Standing Eyes Closed Limitations tactile, verbal & visual (mirror) prior to closing eyes for equal weight bearing                  PT Short Term Goals - 12/30/20 1702       PT SHORT TERM GOAL #1   Title Patient donnes prosthesis modified independent & verbalizes proper cleaning.    Time 4    Period Weeks    Status New    Target Date 01/22/21      PT SHORT TERM GOAL #2   Title Patient tolerates prosthesis >10 hrs total /day without increase in skin issues or limb pain >5/10 after standing.    Time 4    Period Weeks    Status New     Target Date 01/22/21      PT SHORT TERM GOAL #3   Title Patient able to reach 7" and look over both shoulders without UE support with supervision.    Time 4    Period Weeks    Status New    Target Date 01/22/21      PT SHORT TERM GOAL #4   Title Patient ambulates  200' with RW & prosthesis with supervision.    Time 4    Period Weeks    Status New    Target Date 01/22/21      PT SHORT TERM GOAL #5   Title .Patient negotiates ramps & curbs with RW & prosthesis with supervision.    Time 4    Period Weeks    Status New    Target Date 01/22/21               PT Long Term Goals - 12/30/20 1700       PT LONG TERM GOAL #1   Title Patient demonstrates & verbalized understanding of prosthetic care to enable safe utilization of prosthesis.    Time 8    Period Weeks    Status New    Target Date 02/19/21      PT LONG TERM GOAL #2   Title Patient tolerates prosthesis wear for >90% of awake hours without skin issues & limb pain </= 2/10.    Time 8    Period Weeks    Status New    Target Date 02/19/21      PT LONG TERM GOAL #3   Title Berg Balance >/= 45/56 to indicate lower fall risk    Time 8    Period Weeks    Status New    Target Date 02/19/21      PT LONG TERM GOAL #4   Title Patient ambulates 300' with LRAD & prosthesis modified independent    Time 8    Period Weeks    Status New    Target Date 02/19/21      PT LONG TERM GOAL #5   Title Patient negotiates ramps, curbs & stairs with LRAD & prosthesis modified independent.    Time 8    Period Weeks    Status New    Target Date 02/19/21                   Plan - 01/06/21 1309     Clinical Impression Statement Patient appears safe ambulating with axillary crutches & TTA prosthesis on level ground and has basic understanding for barriers.  He is tolerating increased wear & prosthetic pressure with smaller increases in limb pain.    Personal Factors and Comorbidities Time since onset of  injury/illness/exacerbation    Examination-Activity Limitations Carry;Lift;Locomotion Level;Reach Overhead;Squat;Stand;Transfers    Examination-Participation Restrictions Community Activity;Occupation    Stability/Clinical Decision Making Evolving/Moderate complexity    Rehab Potential Good    PT Frequency 2x / week    PT Duration 8 weeks    PT Treatment/Interventions ADLs/Self Care Home Management;DME Instruction;Gait training;Stair training;Functional mobility training;Therapeutic activities;Therapeutic exercise;Balance training;Neuromuscular re-education;Patient/family education;Prosthetic Training;Scar mobilization;Manual techniques;Vestibular    PT Next Visit Plan update prosthetic care,  prosthetic gait with crutches, balance activities    Consulted and Agree with Plan of Care Patient             Patient will benefit from skilled therapeutic intervention in order to improve the following deficits and impairments:  Abnormal gait, Decreased activity tolerance, Decreased balance, Decreased endurance, Decreased knowledge of use of DME, Decreased mobility, Decreased range of motion, Decreased scar mobility, Decreased strength, Decreased skin integrity, Increased edema, Impaired flexibility, Postural dysfunction, Prosthetic Dependency, Pain  Visit Diagnosis: Unsteadiness on feet  Other abnormalities of gait and mobility  Muscle weakness (generalized)  Pain in right lower leg  Abnormal posture     Problem List Patient Active Problem List  Diagnosis Date Noted   Vitamin D insufficiency 07/30/2020   Nicotine dependence 07/11/2020   Chronic pain syndrome 07/11/2020   Chronic, continuous use of opioids 07/11/2020   Multiple open fractures of right foot 07/11/2020   Fracture of right tibia and fibula, open type III, initial encounter 07/10/2020    Jamey Reas, PT, DPT 01/06/2021, 1:12 PM  [facility] Physical Therapy [address] [place], [place],  [address] Phone: [phone]   Fax:  [phone]  Name: Eric Snyder MRN: [id number] Date of Birth: Jan 27, 1966

## 2021-01-07 ENCOUNTER — Encounter: Payer: BC Managed Care – PPO | Admitting: Physical Therapy

## 2021-01-12 DIAGNOSIS — G8929 Other chronic pain: Secondary | ICD-10-CM | POA: Diagnosis not present

## 2021-01-13 ENCOUNTER — Encounter: Payer: BC Managed Care – PPO | Admitting: Physical Therapy

## 2021-01-19 ENCOUNTER — Encounter: Payer: BC Managed Care – PPO | Admitting: Physical Therapy

## 2021-01-21 ENCOUNTER — Encounter: Payer: BC Managed Care – PPO | Admitting: Physical Therapy

## 2021-01-26 ENCOUNTER — Encounter: Payer: BC Managed Care – PPO | Admitting: Physical Therapy

## 2021-01-28 ENCOUNTER — Encounter: Payer: BC Managed Care – PPO | Admitting: Physical Therapy

## 2021-02-02 ENCOUNTER — Encounter: Payer: BC Managed Care – PPO | Admitting: Physical Therapy

## 2021-02-04 ENCOUNTER — Encounter: Payer: BC Managed Care – PPO | Admitting: Physical Therapy

## 2021-02-04 DIAGNOSIS — S82401F Unspecified fracture of shaft of right fibula, subsequent encounter for open fracture type IIIA, IIIB, or IIIC with routine healing: Secondary | ICD-10-CM | POA: Diagnosis not present

## 2021-02-04 DIAGNOSIS — S82201F Unspecified fracture of shaft of right tibia, subsequent encounter for open fracture type IIIA, IIIB, or IIIC with routine healing: Secondary | ICD-10-CM | POA: Diagnosis not present

## 2021-02-04 DIAGNOSIS — T8734 Neuroma of amputation stump, left lower extremity: Secondary | ICD-10-CM | POA: Diagnosis not present

## 2021-02-09 ENCOUNTER — Ambulatory Visit: Payer: BC Managed Care – PPO

## 2021-02-09 ENCOUNTER — Encounter: Payer: Self-pay | Admitting: Orthopedic Surgery

## 2021-02-09 ENCOUNTER — Other Ambulatory Visit: Payer: Self-pay

## 2021-02-09 ENCOUNTER — Encounter: Payer: BC Managed Care – PPO | Admitting: Physical Therapy

## 2021-02-09 ENCOUNTER — Ambulatory Visit (INDEPENDENT_AMBULATORY_CARE_PROVIDER_SITE_OTHER): Payer: BC Managed Care – PPO | Admitting: Orthopedic Surgery

## 2021-02-09 DIAGNOSIS — Z89511 Acquired absence of right leg below knee: Secondary | ICD-10-CM | POA: Diagnosis not present

## 2021-02-09 DIAGNOSIS — D3613 Benign neoplasm of peripheral nerves and autonomic nervous system of lower limb, including hip: Secondary | ICD-10-CM

## 2021-02-09 DIAGNOSIS — M79604 Pain in right leg: Secondary | ICD-10-CM | POA: Diagnosis not present

## 2021-02-09 MED ORDER — DULOXETINE HCL 30 MG PO CPEP
30.0000 mg | ORAL_CAPSULE | Freq: Every morning | ORAL | 0 refills | Status: DC
Start: 2021-02-09 — End: 2021-03-09
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
## 2021-02-10 ENCOUNTER — Telehealth: Payer: Self-pay | Admitting: Orthopedic Surgery

## 2021-02-10 ENCOUNTER — Other Ambulatory Visit: Payer: Self-pay | Admitting: Orthopedic Surgery

## 2021-02-10 MED ORDER — GABAPENTIN 300 MG PO CAPS: 300.0000 mg | ORAL_CAPSULE | Freq: Three times a day (TID) | ORAL | 3 refills | Status: DC

## 2021-02-10 NOTE — Telephone Encounter (Signed)
Pt was seen in office yesterday, hx Right BKA. Recommended gapapentin 300mg  TID and cymbalta for complex regional pain. He did get his cymbalta Rx but not the gabapentin.

## 2021-02-10 NOTE — Telephone Encounter (Signed)
Pt called requesting Dr. Sharol Given stated he sent a script of gabapentin also but pharmacy did not have a order for it. Please send script of gabapentin. Please call pt at [phone].

## 2021-02-11 ENCOUNTER — Encounter: Payer: BC Managed Care – PPO | Admitting: Physical Therapy

## 2021-02-11 NOTE — Telephone Encounter (Signed)
Pt informed

## 2021-02-17 ENCOUNTER — Encounter: Payer: BC Managed Care – PPO | Admitting: Physical Therapy

## 2021-02-19 ENCOUNTER — Encounter: Payer: BC Managed Care – PPO | Admitting: Physical Therapy

## 2021-02-26 ENCOUNTER — Telehealth: Payer: Self-pay | Admitting: Orthopedic Surgery

## 2021-02-26 ENCOUNTER — Other Ambulatory Visit: Payer: Self-pay

## 2021-02-26 ENCOUNTER — Ambulatory Visit (INDEPENDENT_AMBULATORY_CARE_PROVIDER_SITE_OTHER): Payer: BC Managed Care – PPO | Admitting: Orthopedic Surgery

## 2021-02-26 DIAGNOSIS — D3613 Benign neoplasm of peripheral nerves and autonomic nervous system of lower limb, including hip: Secondary | ICD-10-CM

## 2021-02-26 DIAGNOSIS — Z89511 Acquired absence of right leg below knee: Secondary | ICD-10-CM | POA: Diagnosis not present

## 2021-02-26 NOTE — Telephone Encounter (Signed)
Dr. Sharol Given completed fax request sent by [facility].

## 2021-02-26 NOTE — Telephone Encounter (Signed)
Patient's wife Clearnce Sorrel asked if the patient will have something done with the nerve showing on  his right leg (Exposed). Alyson said the nerve was suppose to be burned or cut out. Alyson asked when can this be done. Alyson asked if Dr Sharol Given will call and talk directly to her so that she can explain her husbands situation. Alyson said patient is very passive and would say everything  is ok.  The number to contact Alyson is [phone]

## 2021-02-26 NOTE — Telephone Encounter (Signed)
Received medical records release form from patient  

## 2021-02-27 ENCOUNTER — Telehealth: Payer: Self-pay

## 2021-02-27 NOTE — Telephone Encounter (Signed)
Pt was in the office yesterday please see message below from pt's wife    Patient's wife Alyson asked if the patient will have something done with the nerve showing on  his right leg (Exposed). Alyson said the nerve was suppose to be burned or cut out. Alyson asked when can this be done. Alyson asked if Dr Sharol Given will call and talk directly to her so that she can explain her husbands situation. Alyson said patient is very passive and would say everything  is ok.  The number to contact Alyson is [phone]

## 2021-03-08 ENCOUNTER — Other Ambulatory Visit: Payer: Self-pay | Admitting: Orthopedic Surgery

## 2021-03-09 DIAGNOSIS — G8929 Other chronic pain: Secondary | ICD-10-CM | POA: Diagnosis not present

## 2021-03-12 ENCOUNTER — Encounter: Payer: Self-pay | Admitting: Physical Therapy

## 2021-03-12 NOTE — Therapy (Signed)
[facility] Physical Therapy [address] [place], [place], [address] Phone: [phone]   Fax:  [phone]  Patient Details  Name: Eric Snyder MRN: [id number] Date of Birth: 11-08-1965 Referring Provider:  Altamese [place], MD  PHYSICAL THERAPY DISCHARGE SUMMARY  Visits from Start of Care: 3  Current functional level related to goals / functional outcomes: Patient did not complete PT course.  He did not return after 3rd visit so unable to reassess goals.    Remaining deficits: Patient was still limited with prosthesis wear & use.    Education / Equipment: Prosthetic care   Patient agrees to discharge. Patient goals were not met. Patient is being discharged due to not returning since the last visit.   Encounter Date: 03/12/2021   Jamey Reas, PT, DPT 03/12/2021, 10:04 AM  [facility] Physical Therapy [address] [place], [place], [address] Phone: [phone]   Fax:  [phone]

## 2021-03-29 ENCOUNTER — Encounter: Payer: Self-pay | Admitting: Orthopedic Surgery

## 2021-03-29 NOTE — Progress Notes (Signed)
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  I placed him on Neurontin 300 mg 3 times a day and Cymbalta 30 mg every morning.  Patient is currently ambulating with crutches and his prosthesis patient states he notices a big difference after the medication and feels well.  Assessment & Plan: Visit Diagnoses:  1. Acquired absence of right leg below knee ([place])   2. Neuroma of right leg     Plan: The dystrophic changes have resolved.  Patient will wean off the medicine reevaluate in 3 months.  Follow-Up Instructions: Return in about 3 months (around 05/27/2021).   Ortho Exam  Patient is alert, oriented, no adenopathy, well-dressed, normal affect, normal respiratory effort. Examination the palpable neuroma is not present.  There is good contour to the residual limb there is good hair growth there is no hypersensitivity to light touch.  There is no tenderness to palpation of the previous neuroma.  Imaging: No results found. No images are attached to the encounter.  Labs: Lab Results  Component Value Date   REPTSTATUS 10/16/2013 FINAL 10/16/2013   REPTSTATUS 10/20/2013 FINAL 10/16/2013   GRAMSTAIN  10/16/2013    CYTOSPIN WBC PRESENT, PREDOMINANTLY MONONUCLEAR NO ORGANISMS SEEN   GRAMSTAIN  10/16/2013    WBC PRESENT, PREDOMINANTLY MONONUCLEAR NO ORGANISMS SEEN CYTOSPIN SLIDE Performed at [facility] Performed at [place]  10/16/2013    NO GROWTH 3 DAYS Performed at [facility]      Lab Results  Component Value Date   ALBUMIN 2.8 (L) 07/11/2020   ALBUMIN 4.0 07/09/2020   ALBUMIN 4.2 03/05/2015    Lab Results  Component Value Date   MG 2.0 07/11/2020   Lab Results  Component Value Date   VD25OH 28.28 (L) 07/11/2020    No results found for: PREALBUMIN CBC EXTENDED Latest Ref Rng & Units 07/15/2020 07/13/2020 07/12/2020  WBC 4.0 - 10.5 K/uL 11.8(H) 9.9 8.6  RBC 4.22 - 5.81 MIL/uL 3.06(L) 2.70(L) 2.69(L)  HGB 13.0 - 17.0 g/dL 9.2(L) 8.2(L) 8.1(L)  HCT 39.0 - 52.0 % 27.9(L) 24.7(L) 24.8(L)  PLT 150 - 400 K/uL 244 146(L) 122(L)  NEUTROABS 1.7 - 7.7 K/uL - - -  LYMPHSABS 0.7 - 4.0 K/uL - - -     There is no height or weight on file to calculate BMI.  Orders:  No orders of the defined types were placed in this encounter.  No orders of the defined types were placed in this encounter.    Procedures: No procedures performed  Clinical Data: No additional findings.  ROS:  All other systems negative, except as noted in the HPI. Review of Systems  Objective: Vital Signs: There were no vitals taken for this visit.  Specialty Comments:  No specialty comments available.  PMFS History: Patient Active Problem List   Diagnosis Date Noted  Vitamin D insufficiency 07/30/2020   Nicotine dependence 07/11/2020   Chronic pain syndrome 07/11/2020   Chronic, continuous use of opioids 07/11/2020   Multiple open fractures of right foot 07/11/2020   Fracture of right tibia and fibula, open type III, initial encounter 07/10/2020   Past Medical History:  Diagnosis Date   Chronic pain syndrome 07/11/2020   Chronic, continuous use of opioids 07/11/2020   Medical history non-contributory    Multiple open fractures of right foot 07/11/2020   Nicotine dependence 07/11/2020   Vitamin D insufficiency 07/30/2020    Family History  Problem Relation Age of Onset   CAD Mother    CAD Maternal Grandfather     Past Surgical History:  Procedure Laterality Date    AMPUTATION Right 07/10/2020   Procedure: IRRIGATION AND DEBRIDEMENT OF ANKLE with foot/ankle  AMPUTATION;  Surgeon: Altamese Hypoluxo, MD;  Location: [place];  Service: Orthopedics;  Laterality: Right;   EXTERNAL FIXATION LEG Right 07/09/2020   Procedure: EXTERNAL FIXATION RIGHT TIB/FIB   Wash out;  Surgeon: Renette Butters, MD;  Location: [place];  Service: Orthopedics;  Laterality: Right;   HERNIA REPAIR     INGUINAL HERNIA REPAIR Bilateral 10/31/2014   Procedure: LAPAROSCOPIC INGUINAL HERNIA with mesh ;  Surgeon: Clayburn Pert, MD;  Location: [facility];  Service: General;  Laterality: Bilateral;   NO PAST SURGERIES     Social History   Occupational History   Not on file  Tobacco Use   Smoking status: Every Day    Packs/day: 1.00    Years: 30.00    Pack years: 30.00    Types: Cigarettes   Smokeless tobacco: Never  Substance and Sexual Activity   Alcohol use: No   Drug use: No   Sexual activity: Yes

## 2021-03-30 ENCOUNTER — Ambulatory Visit (INDEPENDENT_AMBULATORY_CARE_PROVIDER_SITE_OTHER): Payer: BC Managed Care – PPO | Admitting: Orthopedic Surgery

## 2021-03-30 ENCOUNTER — Other Ambulatory Visit: Payer: Self-pay

## 2021-03-30 ENCOUNTER — Encounter: Payer: Self-pay | Admitting: Orthopedic Surgery

## 2021-03-30 DIAGNOSIS — Z89511 Acquired absence of right leg below knee: Secondary | ICD-10-CM | POA: Diagnosis not present

## 2021-03-30 DIAGNOSIS — D3613 Benign neoplasm of peripheral nerves and autonomic nervous system of lower limb, including hip: Secondary | ICD-10-CM

## 2021-03-30 NOTE — Progress Notes (Addendum)
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  Patient was placed on Neurontin and Cymbalta which helped but patient states he still has an baring pain and pain over the 2 neuromas.  Assessment & Plan: Visit Diagnoses:  1. 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 Neuroma of right leg     Plan: Patient has lost significant volume in the residual limb.  I have recommended patient follow-up with biotech to obtain a smaller socket to see if this can resolve the neuroma and in bearing pain.  Discussed that if this does not work we can proceed with resection of the neuromas.  Discussed that if his pain is increased after neuroma resection patient may require an above-the-knee amputation.  Patient states he understands and wishes to proceed with a step-by-step approach to salvage the residual limb.  Follow-Up Instructions: Return if symptoms worsen or fail to improve.   Ortho Exam  Patient is alert, oriented, no adenopathy, well-dressed, normal affect, normal respiratory effort. Examination patient has good wrinkling of the skin he has a good shape to the residual limb.  He has pain to palpation over the end of the tibia also has pain over the anterior tibial nerve as well as over the popliteal nerve.  There is no redness no cellulitis no dystrophic skin color or temperature changes.  The socket is very loose and despite multiple ply sock patient still is subsiding into the socket.  Patient is an existing right  transtibial  amputee.  Patient's current comorbidities are not expected to impact the ability to function with the prescribed prosthesis. Patient verbally communicates a strong desire to use a prosthesis. Patient currently requires mobility aids to ambulate without a prosthesis.  Expects not to use mobility aids with a new prosthesis.  Patient is a K3 level ambulator that spends a lot of time walking around on uneven terrain over obstacles, up and down stairs, and ambulates with a variable cadence.     Imaging: No results found. No images are attached to the encounter.  Labs: Lab Results  Component Value Date   REPTSTATUS 10/16/2013 FINAL 10/16/2013   REPTSTATUS 10/20/2013 FINAL 10/16/2013   GRAMSTAIN  10/16/2013    CYTOSPIN WBC PRESENT, PREDOMINANTLY MONONUCLEAR NO ORGANISMS SEEN   GRAMSTAIN  10/16/2013    WBC PRESENT, PREDOMINANTLY MONONUCLEAR NO ORGANISMS SEEN CYTOSPIN SLIDE Performed at [facility] Performed at [place]  10/16/2013    NO GROWTH 3 DAYS Performed at [facility]     Lab Results  Component Value Date   ALBUMIN 2.8 (L) 07/11/2020   ALBUMIN 4.0 07/09/2020   ALBUMIN 4.2 03/05/2015    Lab Results  Component Value Date   MG 2.0 07/11/2020   Lab Results  Component Value Date   VD25OH 28.28 (L) 07/11/2020    No  results found for: PREALBUMIN CBC EXTENDED Latest Ref Rng & Units 07/15/2020 07/13/2020 07/12/2020  WBC 4.0 - 10.5 K/uL 11.8(H) 9.9 8.6  RBC 4.22 - 5.81 MIL/uL 3.06(L) 2.70(L) 2.69(L)  HGB 13.0 - 17.0 g/dL 9.2(L) 8.2(L) 8.1(L)  HCT 39.0 - 52.0 % 27.9(L) 24.7(L) 24.8(L)  PLT 150 - 400 K/uL 244 146(L) 122(L)  NEUTROABS 1.7 - 7.7 K/uL - - -  LYMPHSABS 0.7 - 4.0 K/uL - - -     There is no height or weight on file to calculate BMI.  Orders:  No orders of the defined types were placed in this encounter.  No orders of the defined types were placed in this encounter.    Procedures: No procedures  performed  Clinical Data: No additional findings.  ROS:  All other systems negative, except as noted in the HPI. Review of Systems  Objective: Vital Signs: There were no vitals taken for this visit.  Specialty Comments:  No specialty comments available.  PMFS History: Patient Active Problem List   Diagnosis Date Noted   Vitamin D insufficiency 07/30/2020   Nicotine dependence 07/11/2020   Chronic pain syndrome 07/11/2020   Chronic, continuous use of opioids 07/11/2020   Multiple open fractures of right foot 07/11/2020   Fracture of right tibia and fibula, open type III, initial encounter 07/10/2020   Past Medical History:  Diagnosis Date   Chronic pain syndrome 07/11/2020   Chronic, continuous use of opioids 07/11/2020   Medical history non-contributory    Multiple open fractures of right foot 07/11/2020   Nicotine dependence 07/11/2020   Vitamin D insufficiency 07/30/2020    Family History  Problem Relation Age of Onset   CAD Mother    CAD Maternal Grandfather     Past Surgical History:  Procedure Laterality Date   AMPUTATION Right 07/10/2020   Procedure: IRRIGATION AND DEBRIDEMENT OF ANKLE with foot/ankle  AMPUTATION;  Surgeon: Altamese Dunlo, MD;  Location: [facility];  Service: Orthopedics;  Laterality: Right;   EXTERNAL FIXATION LEG Right 07/09/2020   Procedure: EXTERNAL FIXATION RIGHT TIB/FIB   Wash out;  Surgeon: Renette Butters, MD;  Location: [place];  Service: Orthopedics;  Laterality: Right;   HERNIA REPAIR     INGUINAL HERNIA REPAIR Bilateral 10/31/2014   Procedure: LAPAROSCOPIC INGUINAL HERNIA with mesh ;  Surgeon: Clayburn Pert, MD;  Location: [facility];  Service: General;  Laterality: Bilateral;   NO PAST SURGERIES     Social History   Occupational History   Not on file  Tobacco Use   Smoking status: Every Day    Packs/day: 1.00    Years: 30.00    Pack years: 30.00    Types: Cigarettes   Smokeless tobacco: Never  Substance and Sexual Activity   Alcohol  use: No   Drug use: No   Sexual activity: Yes

## 2021-04-17 ENCOUNTER — Other Ambulatory Visit: Payer: Self-pay | Admitting: Orthopedic Surgery

## 2021-05-28 ENCOUNTER — Ambulatory Visit: Payer: BC Managed Care – PPO | Admitting: Orthopedic Surgery

## 2021-05-28 ENCOUNTER — Other Ambulatory Visit: Payer: Self-pay | Admitting: Orthopedic Surgery

## 2021-06-09 DIAGNOSIS — R202 Paresthesia of skin: Secondary | ICD-10-CM | POA: Diagnosis not present

## 2021-06-09 DIAGNOSIS — G894 Chronic pain syndrome: Secondary | ICD-10-CM | POA: Diagnosis not present

## 2021-06-23 DIAGNOSIS — T8733 Neuroma of amputation stump, right lower extremity: Secondary | ICD-10-CM | POA: Diagnosis not present

## 2021-07-16 ENCOUNTER — Other Ambulatory Visit: Payer: Self-pay | Admitting: Orthopedic Surgery

## 2021-07-27 DIAGNOSIS — G8929 Other chronic pain: Secondary | ICD-10-CM | POA: Diagnosis not present

## 2021-08-16 ENCOUNTER — Other Ambulatory Visit: Payer: Self-pay

## 2021-08-16 ENCOUNTER — Emergency Department (HOSPITAL_BASED_OUTPATIENT_CLINIC_OR_DEPARTMENT_OTHER): Payer: BC Managed Care – PPO | Admitting: Radiology

## 2021-08-16 ENCOUNTER — Encounter (HOSPITAL_BASED_OUTPATIENT_CLINIC_OR_DEPARTMENT_OTHER): Payer: Self-pay | Admitting: Emergency Medicine

## 2021-08-16 ENCOUNTER — Emergency Department (HOSPITAL_BASED_OUTPATIENT_CLINIC_OR_DEPARTMENT_OTHER): Payer: BC Managed Care – PPO

## 2021-08-16 ENCOUNTER — Emergency Department (HOSPITAL_BASED_OUTPATIENT_CLINIC_OR_DEPARTMENT_OTHER)
Admission: EM | Admit: 2021-08-16 | Discharge: 2021-08-16 | Disposition: A | Discharge status: Home / Self Care | Payer: BC Managed Care – PPO | Attending: Emergency Medicine | Admitting: Emergency Medicine

## 2021-08-16 DIAGNOSIS — J432 Centrilobular emphysema: Secondary | ICD-10-CM | POA: Diagnosis not present

## 2021-08-16 DIAGNOSIS — M7989 Other specified soft tissue disorders: Secondary | ICD-10-CM | POA: Insufficient documentation

## 2021-08-16 DIAGNOSIS — Z7901 Long term (current) use of anticoagulants: Secondary | ICD-10-CM | POA: Insufficient documentation

## 2021-08-16 DIAGNOSIS — F17210 Nicotine dependence, cigarettes, uncomplicated: Secondary | ICD-10-CM | POA: Insufficient documentation

## 2021-08-16 DIAGNOSIS — J984 Other disorders of lung: Secondary | ICD-10-CM | POA: Diagnosis not present

## 2021-08-16 DIAGNOSIS — R7309 Other abnormal glucose: Secondary | ICD-10-CM | POA: Insufficient documentation

## 2021-08-16 DIAGNOSIS — R0600 Dyspnea, unspecified: Secondary | ICD-10-CM | POA: Insufficient documentation

## 2021-08-16 DIAGNOSIS — R059 Cough, unspecified: Secondary | ICD-10-CM | POA: Insufficient documentation

## 2021-08-16 DIAGNOSIS — R0602 Shortness of breath: Secondary | ICD-10-CM | POA: Diagnosis not present

## 2021-08-16 DIAGNOSIS — R600 Localized edema: Secondary | ICD-10-CM | POA: Diagnosis not present

## 2021-08-16 LAB — COMPREHENSIVE METABOLIC PANEL
ALT: 14 U/L (ref 0–44)
AST: 16 U/L (ref 15–41)
Albumin: 4.3 g/dL (ref 3.5–5.0)
Alkaline Phosphatase: 52 U/L (ref 38–126)
Anion gap: 6 (ref 5–15)
BUN: 15 mg/dL (ref 6–20)
CO2: 25 mmol/L (ref 22–32)
Calcium: 9.5 mg/dL (ref 8.9–10.3)
Chloride: 106 mmol/L (ref 98–111)
Creatinine, Ser: 0.96 mg/dL (ref 0.61–1.24)
GFR, Estimated: >60 mL/min (ref >60)
Glucose, Bld: 105 mg/dL — ABNORMAL HIGH (ref 70–99)
Potassium: 4 mmol/L (ref 3.5–5.1)
Sodium: 137 mmol/L (ref 135–145)
Total Bilirubin: 0.3 mg/dL (ref 0.3–1.2)
Total Protein: 6.8 g/dL (ref 6.5–8.1)

## 2021-08-16 LAB — TROPONIN I (HIGH SENSITIVITY)
Troponin I (High Sensitivity): 5 ng/L (ref <18)
Troponin I (High Sensitivity): 4 ng/L (ref <18)

## 2021-08-16 LAB — CBC WITH DIFFERENTIAL/PLATELET
Abs Immature Granulocytes: 0.02 10*3/uL (ref 0.00–0.07)
Basophils Absolute: 0.1 10*3/uL (ref 0.0–0.1)
Basophils Relative: 1 %
Eosinophils Absolute: 0.2 10*3/uL (ref 0.0–0.5)
Eosinophils Relative: 2 %
HCT: 33.9 % — ABNORMAL LOW (ref 39.0–52.0)
Hemoglobin: 10.2 g/dL — ABNORMAL LOW (ref 13.0–17.0)
Immature Granulocytes: 0 %
Lymphocytes Relative: 23 %
Lymphs Abs: 1.9 10*3/uL (ref 0.7–4.0)
MCH: 22 pg — ABNORMAL LOW (ref 26.0–34.0)
MCHC: 30.1 g/dL (ref 30.0–36.0)
MCV: 73.1 fL — ABNORMAL LOW (ref 80.0–100.0)
Monocytes Absolute: 0.4 10*3/uL (ref 0.1–1.0)
Monocytes Relative: 5 %
Neutro Abs: 5.5 10*3/uL (ref 1.7–7.7)
Neutrophils Relative %: 69 %
Platelets: 238 10*3/uL (ref 150–400)
RBC: 4.64 MIL/uL (ref 4.22–5.81)
RDW: 17.1 % — ABNORMAL HIGH (ref 11.5–15.5)
WBC: 8 10*3/uL (ref 4.0–10.5)
nRBC: 0 % (ref 0.0–0.2)

## 2021-08-16 LAB — BRAIN NATRIURETIC PEPTIDE: B Natriuretic Peptide: 44.5 pg/mL (ref 0.0–100.0)

## 2021-08-16 MED ORDER — ALBUTEROL SULFATE HFA 108 (90 BASE) MCG/ACT IN AERS
1.0000 | INHALATION_SPRAY | Freq: Once | RESPIRATORY_TRACT | Status: AC
  Administered 2021-08-16: 2 via RESPIRATORY_TRACT
  Filled 2021-08-16: qty 6.7

## 2021-08-16 MED ORDER — ALBUTEROL SULFATE (2.5 MG/3ML) 0.083% IN NEBU
2.5000 mg | INHALATION_SOLUTION | RESPIRATORY_TRACT | Status: AC
  Administered 2021-08-16: 2.5 mg via RESPIRATORY_TRACT
  Filled 2021-08-16: qty 3

## 2021-08-16 MED ORDER — AEROCHAMBER PLUS FLO-VU MEDIUM MISC
1.0000 | Freq: Once | Status: AC
  Administered 2021-08-16: 1
  Filled 2021-08-16: qty 1

## 2021-08-16 MED ORDER — FUROSEMIDE 20 MG PO TABS: 20.0000 mg | ORAL_TABLET | Freq: Every day | ORAL | 0 refills | Status: DC

## 2021-08-16 MED ORDER — IPRATROPIUM-ALBUTEROL 0.5-2.5 (3) MG/3ML IN SOLN: 3.0000 mL | Freq: Once | RESPIRATORY_TRACT | Status: DC

## 2021-08-16 MED ORDER — FUROSEMIDE 10 MG/ML IJ SOLN
20.0000 mg | Freq: Once | INTRAMUSCULAR | Status: AC
  Administered 2021-08-16: 20 mg via INTRAVENOUS
  Filled 2021-08-16: qty 2

## 2021-08-16 MED ORDER — IOHEXOL 350 MG/ML SOLN
100.0000 mL | Freq: Once | INTRAVENOUS | Status: AC | PRN
  Administered 2021-08-16: 100 mL via INTRAVENOUS

## 2021-08-16 MED ORDER — PREDNISONE 10 MG PO TABS: 40.0000 mg | ORAL_TABLET | Freq: Every day | ORAL | 0 refills | Status: DC

## 2021-08-16 NOTE — ED Provider Notes (Signed)
[facility] EMERGENCY DEPT Provider Note   CSN: [id number] Arrival date & time: 08/16/21  1233     History Chief Complaint  Patient presents with   Shortness of Breath    Eric Snyder is a 56 y.o. male with h/o chronic pain s/p right BKA presents to the ED with wife for evaluation of SOB for the past few days. The wife reports that his SOB worsened yesterday and he had trouble sleeping because of the breathing. Additionally mentions that his left leg as been swollen for the past 4 days at least. The patient denies any chest pain. Wife reports he has had a cough. Denies any fever, nasal congestion, or rhinorrhea symptoms. Denies any nausea, vomiting, abdominal pain. Patient is at least a half pack per day smoker. He denies a h/o CHF or COPD/asthma.    Shortness of Breath Associated symptoms: cough   Associated symptoms: no abdominal pain, no chest pain, no diaphoresis, no fever, no headaches and no vomiting        Home Medications Prior to Admission medications   Medication Sig Start Date End Date Taking? Authorizing Provider  acetaminophen (TYLENOL) 500 MG tablet Take 2 tablets (1,000 mg total) by mouth every 8 (eight) hours. 07/15/20   Ainsley Spinner, PA-C  Ascorbic Acid (VITA-C PO) Take 2 each by mouth daily.    [provider]  Cholecalciferol (VITAMIN D-3 PO) Take 1 tablet by mouth daily.    [provider]  Cholecalciferol 125 MCG (5000 UT) TABS Take 1 tablet (5,000 Units total) by mouth daily. 07/15/20   Ainsley Spinner, PA-C  cyclobenzaprine (FLEXERIL) 10 MG tablet Take 1 tablet (10 mg total) by mouth 2 (two) times daily as needed for muscle spasms. 11/25/15   Delsa Grana, PA-C  diclofenac (VOLTAREN) 25 MG EC tablet Take 25 mg by mouth 2 (two) times daily. Reported on 05/26/2015    [provider]  docusate sodium (COLACE) 100 MG capsule Take 1 capsule (100 mg total) by mouth 2 (two) times daily. 07/15/20   Ainsley Spinner, PA-C  DULoxetine  (CYMBALTA) 30 MG capsule TAKE 1 CAPSULE(30 MG) BY MOUTH IN THE MORNING 07/17/21   Suzan Slick, NP  gabapentin (NEURONTIN) 300 MG capsule Take 1 capsule (300 mg total) by mouth 3 (three) times daily. 3 times a day when necessary neuropathy pain 02/10/21   Newt Minion, MD  MAGNESIUM PO Take 1 tablet by mouth daily as needed (for muscles).    [provider]  methocarbamol (ROBAXIN) 500 MG tablet Take 1-2 tablets (500-1,000 mg total) by mouth every 8 (eight) hours as needed for muscle spasms. 07/15/20   Ainsley Spinner, PA-C  naloxone [facility]) nasal spray 4 mg/0.1 mL Place 0.4 mg into the nose once as needed (opioid overdose).    [provider]  ondansetron (ZOFRAN ODT) 4 MG disintegrating tablet Take 1 tablet (4 mg total) by mouth every 8 (eight) hours as needed for nausea or vomiting. 11/25/15   Delsa Grana, PA-C  oxycodone (ROXICODONE) 30 MG immediate release tablet Take 30 mg by mouth 5 (five) times daily. scheduled 07/04/20   [provider]  oxyCODONE-acetaminophen (PERCOCET) 5-325 MG tablet Take 1 tablet by mouth every 4 (four) hours as needed. 11/25/15   Delsa Grana, PA-C  oxyCODONE-acetaminophen (PERCOCET/ROXICET) 5-325 MG tablet Take 1 tablet by mouth every 4 (four) hours as needed for severe pain.    [provider]  predniSONE (DELTASONE) 20 MG tablet Take 2 tablets (40 mg total) by  mouth daily. Take 40 mg by mouth daily for 3 days, then '20mg'$  by mouth daily for 3 days, then '10mg'$  daily for 3 days 11/25/15   Delsa Grana, PA-C  pregabalin (LYRICA) 75 MG capsule Take 1 capsule (75 mg total) by mouth 2 (two) times daily. 07/15/20 09/13/20  Ainsley Spinner, PA-C  Rivaroxaban (XARELTO) 15 MG TABS tablet Take 1 tablet (15 mg total) by mouth daily with supper. 07/15/20   Ainsley Spinner, PA-C      Allergies    Patient has no known allergies.    Review of Systems   Review of Systems  Constitutional:  Negative for chills, diaphoresis and fever.  HENT:  Negative for  congestion and rhinorrhea.   Respiratory:  Positive for cough and shortness of breath.   Cardiovascular:  Positive for leg swelling. Negative for chest pain and palpitations.  Gastrointestinal:  Negative for abdominal pain, constipation, diarrhea, nausea and vomiting.  Genitourinary:  Negative for dysuria and hematuria.  Neurological:  Negative for syncope and headaches.    Physical Exam Updated Vital Signs BP (!) 168/91   Pulse 64   Temp 99.3 F (37.4 C) (Oral)   Resp 18   SpO2 98%  Physical Exam Vitals and nursing note reviewed.  Constitutional:      General: He is not in acute distress.    Appearance: Normal appearance. He is not toxic-appearing.  HENT:     Head: Normocephalic and atraumatic.  Eyes:     General: No scleral icterus. Cardiovascular:     Rate and Rhythm: Normal rate and regular rhythm.  Pulmonary:     Effort: Pulmonary effort is normal. No respiratory distress.     Breath sounds: Decreased breath sounds present.     Comments: Diminished breath sounds throughout. No respiratory distress, accessory muscle use, nasal flaring, tripoding, or cyanosis present. The patient is satting well on room air and speaking in full sentences.  Abdominal:     General: Abdomen is flat. Bowel sounds are normal.     Palpations: Abdomen is soft.  Musculoskeletal:        General: No deformity.     Cervical back: Normal range of motion.     Left lower leg: Edema present.     Comments: 2+ pitting pedal edema going up to the knee on the left leg. Compartments are soft. Palpable pulse. Sensation intact. Right leg BKA.  Skin:    General: Skin is warm and dry.  Neurological:     General: No focal deficit present.     Mental Status: He is alert. Mental status is at baseline.     ED Results / Procedures / Treatments   Labs (all labs ordered are listed, but only abnormal results are displayed) Labs Reviewed  CBC WITH DIFFERENTIAL/PLATELET - Abnormal; Notable for the following  components:      Result Value   Hemoglobin 10.2 (*)    HCT 33.9 (*)    MCV 73.1 (*)    MCH 22.0 (*)    RDW 17.1 (*)    All other components within normal limits  BRAIN NATRIURETIC PEPTIDE  COMPREHENSIVE METABOLIC PANEL  TROPONIN I (HIGH SENSITIVITY)    EKG EKG Interpretation  Date/Time:  Sunday August 16 2021 12:43:34 EDT Ventricular Rate:  61 PR Interval:  152 QRS Duration: 80 QT Interval:  402 QTC Calculation: 405 R Axis:   -22 Text Interpretation: Sinus rhythm Borderline left axis deviation No old tracing to compare Confirmed by Aletta Edouard [phone]) on 08/16/2021  12:49:59 PM  Radiology CT Angio Chest PE W and/or Wo Contrast  Result Date: 08/16/2021 CLINICAL DATA:  Rule out pulmonary embolism.  High probability. EXAM: CT ANGIOGRAPHY CHEST WITH CONTRAST TECHNIQUE: Multidetector CT imaging of the chest was performed using the standard protocol during bolus administration of intravenous contrast. Multiplanar CT image reconstructions and MIPs were obtained to evaluate the vascular anatomy. RADIATION DOSE REDUCTION: This exam was performed according to the departmental dose-optimization program which includes automated exposure control, adjustment of the mA and/or kV according to patient size and/or use of iterative reconstruction technique. CONTRAST:  138m OMNIPAQUE IOHEXOL 350 MG/ML SOLN COMPARISON:  None Available. FINDINGS: Cardiovascular: Satisfactory opacification of the pulmonary arteries to the segmental level. No evidence of pulmonary embolism. Normal heart size. No pericardial effusion. Mediastinum/Nodes: No enlarged mediastinal, hilar, or axillary lymph nodes. Thyroid gland, trachea, and esophagus demonstrate no significant findings. Lungs/Pleura: Subsegmental atelectasis versus scar noted within the inferior medial right middle lobe. Moderate centrilobular emphysema. No pleural effusion, airspace consolidation, atelectasis, or pneumothorax. Biapical pleuroparenchymal scarring  noted. Nodule within the anterolateral left lower lobe measures 3 mm, image 95/6. Upper Abdomen: No acute abnormality. Musculoskeletal: No chest wall abnormality. No acute or significant osseous findings. Review of the MIP images confirms the above findings. IMPRESSION: 1. No evidence for acute pulmonary embolism. 2. 3 mm left solid pulmonary nodule. No routine follow-up imaging is recommended per Fleischner Society Guidelines. These guidelines do not apply to immunocompromised patients and patients with cancer. Follow up in patients with significant comorbidities as clinically warranted. For lung cancer screening, adhere to Lung-RADS guidelines. Reference: Radiology. 2017; 284(1):228-43. 3. Emphysema (ICD10-J43.9). Electronically Signed   By: TKerby MoorsM.D.   On: 08/16/2021 14:54   DG Chest 2 View  Result Date: 08/16/2021 CLINICAL DATA:  Shortness of breath EXAM: CHEST - 2 VIEW COMPARISON:  None Available. FINDINGS: The heart size and mediastinal contours are within normal limits. Both lungs are clear. The visualized skeletal structures are unremarkable. IMPRESSION: No active cardiopulmonary disease. Electronically Signed   By: IKeane PoliceD.O.   On: 08/16/2021 13:35     Procedures Procedures   Medications Ordered in ED Medications  albuterol (PROVENTIL) (2.5 MG/3ML) 0.083% nebulizer solution 2.5 mg (2.5 mg Nebulization Given 08/16/21 1405)  furosemide (LASIX) injection 20 mg (20 mg Intravenous Given 08/16/21 1422)  iohexol (OMNIPAQUE) 350 MG/ML injection 100 mL (100 mLs Intravenous Contrast Given 08/16/21 1427)  albuterol (VENTOLIN HFA) 108 (90 Base) MCG/ACT inhaler 1-2 puff (2 puffs Inhalation Given 08/16/21 1832)  AeroChamber Plus Flo-Vu Medium MISC 1 each (1 each Other Given 08/16/21 1833)    ED Course/ Medical Decision Making/ A&P                           Medical Decision Making Amount and/or Complexity of Data Reviewed Labs: ordered. Radiology: ordered.  Risk Prescription drug  management.   56year old male presents the emergency department for evaluation of shortness of breath.  Differential diagnosis includes but is not limited to COPD, asthma, PE, bronchitis, pneumonia, pneumothorax, CHF, ACS.  Vital signs show elevated blood pressure otherwise unremarkable.  Physical exam as listed above.  We will initially order ACS/SOB work-up with labs and chest x-ray.  Given the patient's history of heavy tobacco use, I did order the patient an albuterol nebulizer.  I independently reviewed and interpreted the patient's labs.  CMP shows mildly elevated glucose although not fasting.  Otherwise no electrolyte or LFT abnormality.  BNP unremarkable at 44.5.  CBC shows slight anemia with a hemoglobin of 10.2.  Normal platelets.  No leukocytosis.  Initial troponin 5 with repeat at 4, delta -1.  Given the patient's lower leg edema, I was suspecting CHF although the patient has a normal BNP.  Will order CT angio of the chest to rule out any pulmonary embolism or any other intrathoracic cause of his shortness of breath.  Chest x-ray shows no active cardiopulmonary process.  CTA shows 1. No evidence for acute pulmonary embolism. 2. 3 mm left solid pulmonary nodule. No routine follow-up imaging is recommended per Fleischner Society Guidelines. These guidelines do not apply to immunocompromised patients and patients with cancer. Follow up in patients with significant comorbidities as clinically warranted.  I ordered the patient an albuterol inhaler and spacer to take home with him.  Since the patient is Lasix nave, I did order him 20 mg of Lasix IV.  On reevaluation, the patient reports that he has an increase in urination.  He reports that he feels better after the albuterol treatments as well.  Given the patient had some relief with the albuterol, could be some bronchitis/COPD.  I do think the patient could benefit from outpatient Lasix.  PCP follow-up recommended as well.  My attending  attached to this chart assessed the patient at bedside and discharged him home with Lasix and prednisone.  I discussed this case with my attending physician who cosigned this note including patient's presenting symptoms, physical exam, and planned diagnostics and interventions. Attending physician stated agreement with plan or made changes to plan which were implemented.   Attending physician assessed patient at bedside.  Final Clinical Impression(s) / ED Diagnoses Final diagnoses:  Dyspnea, unspecified type  Leg swelling    Rx / DC Orders ED Discharge Orders          Ordered    furosemide (LASIX) 20 MG tablet  Daily,   Status:  Discontinued        08/16/21 1937    predniSONE (DELTASONE) 10 MG tablet  Daily        08/16/21 1937              Sherrell Puller, Hershal Coria 08/19/21 1530    Kommor, May, MD 08/20/21 [phone]

## 2021-08-19 ENCOUNTER — Encounter: Payer: Self-pay | Admitting: Physician Assistant

## 2021-08-19 ENCOUNTER — Ambulatory Visit (INDEPENDENT_AMBULATORY_CARE_PROVIDER_SITE_OTHER): Payer: BC Managed Care – PPO | Admitting: Physician Assistant

## 2021-08-19 VITALS — BP 140/84 | HR 64 | Temp 97.3°F | Ht 70.0 in | Wt 206.0 lb

## 2021-08-19 DIAGNOSIS — F17200 Nicotine dependence, unspecified, uncomplicated: Secondary | ICD-10-CM

## 2021-08-19 DIAGNOSIS — R601 Generalized edema: Secondary | ICD-10-CM | POA: Diagnosis not present

## 2021-08-19 DIAGNOSIS — R600 Localized edema: Secondary | ICD-10-CM

## 2021-08-19 DIAGNOSIS — J432 Centrilobular emphysema: Secondary | ICD-10-CM

## 2021-08-19 DIAGNOSIS — Z89511 Acquired absence of right leg below knee: Secondary | ICD-10-CM

## 2021-08-19 DIAGNOSIS — R0602 Shortness of breath: Secondary | ICD-10-CM | POA: Diagnosis not present

## 2021-08-19 MED ORDER — POTASSIUM CHLORIDE CRYS ER 20 MEQ PO TBCR: 20.0000 meq | EXTENDED_RELEASE_TABLET | Freq: Two times a day (BID) | ORAL | 0 refills | Status: DC

## 2021-08-19 MED ORDER — FUROSEMIDE 20 MG PO TABS: 20.0000 mg | ORAL_TABLET | Freq: Two times a day (BID) | ORAL | 0 refills | Status: DC

## 2021-08-19 NOTE — Progress Notes (Unsigned)
Subjective:    Patient ID: Eric Snyder, male    DOB: [birth year], 56 y.o.   MRN: [id number]  Chief Complaint  Patient presents with   Follow-up    ED visit     HPI 56 y.o. patient presents today for new patient establishment with me.  Patient was previously established with Dr. Laney Pastor. Here with his wife today.   Current Care Team: Pain management    Acute Concerns: ED visit on 08/16/21. Started with lower abdominal swelling / bloating. Right lower leg prosthetic - nerve issues / shaking - put on time-released tablet went from oxycontin to oxymorphone due to cost.  Switching from smoking to vaping when symptoms started, now going to [facility]  Left lower leg swelling started slowly  Started on Lasix 20 mg once daily, says he needs to increase  Doesn't drink alcohol. Low salt. Drinks plenty of water. Regular bowel movements. Regular urination.   Chronic Concerns: See PMH listed below, as well as A/P for details on issues we specifically discussed during today's visit.      Past Medical History:  Diagnosis Date   Chronic pain syndrome 07/11/2020   Chronic, continuous use of opioids 07/11/2020   Medical history non-contributory    Multiple open fractures of right foot 07/11/2020   Nicotine dependence 07/11/2020   Vitamin D insufficiency 07/30/2020    Past Surgical History:  Procedure Laterality Date   AMPUTATION Right 07/10/2020   Procedure: IRRIGATION AND DEBRIDEMENT OF ANKLE with foot/ankle  AMPUTATION;  Surgeon: Altamese [place], MD;  Location: [facility];  Service: Orthopedics;  Laterality: Right;   EXTERNAL FIXATION LEG Right 07/09/2020   Procedure: EXTERNAL FIXATION RIGHT TIB/FIB   Wash out;  Surgeon: Renette Butters, MD;  Location: [facility];  Service: Orthopedics;  Laterality: Right;   HERNIA REPAIR     INGUINAL HERNIA REPAIR Bilateral 10/31/2014   Procedure: LAPAROSCOPIC INGUINAL HERNIA with mesh ;  Surgeon: Clayburn Pert, MD;  Location: [facility];  Service: General;   Laterality: Bilateral;   NO PAST SURGERIES      Family History  Problem Relation Age of Onset   CAD Mother    CAD Maternal Grandfather     Social History   Tobacco Use   Smoking status: Every Day    Packs/day: 1.00    Years: 30.00    Total pack years: 30.00    Types: Cigarettes   Smokeless tobacco: Never  Substance Use Topics   Alcohol use: No   Drug use: No     No Known Allergies  Review of Systems NEGATIVE UNLESS OTHERWISE INDICATED IN HPI      Objective:     Ht '5\' 10"'$  (1.778 m)   BMI 26.98 kg/m   Wt Readings from Last 3 Encounters:  07/09/20 188 lb (85.3 kg)  12/04/15 182 lb 12.8 oz (82.9 kg)  11/24/15 185 lb (83.9 kg)    BP Readings from Last 3 Encounters:  08/16/21 (!) 147/90  07/15/20 133/75  12/04/15 120/88     Physical Exam     Assessment & Plan:   Problem List Items Addressed This Visit   None    No orders of the defined types were placed in this encounter.    No follow-ups on file.  This note was prepared with assistance of Systems analyst. Occasional wrong-word or sound-a-like substitutions may have occurred due to the inherent limitations of voice recognition software.  Time Spent: *** minutes of total time was spent on the  date of the encounter performing the following actions: chart review prior to seeing the patient, obtaining history, performing a medically necessary exam, counseling on the treatment plan, placing orders, and documenting in our EHR.       Destiny Trickey M Danyella Mcginty, PA-C

## 2021-08-19 NOTE — Patient Instructions (Signed)
Welcome to [facility]! It was a pleasure meeting you today.  Please start on lasix one tablet twice daily, take with a potassium tablet. Monitor your weight and symptoms.  As discussed, Please schedule a 1 week follow up visit today.  PLEASE NOTE:  If you had any LAB tests please let us know if you have not heard back within a few days. You may see your results on MyChart before we have a chance to review them but we will give you a call once they are reviewed by [place]. If we ordered any REFERRALS today, please let us know if you have not heard from their office within the next two weeks. Let us know through MyChart if you are needing REFILLS, or have your pharmacy send [place] the request. You can also use MyChart to communicate with me or any office staff.  Please try these tips to maintain a healthy lifestyle:  Eat most of your calories during the day when you are active. Eliminate processed foods including packaged sweets (pies, cakes, cookies), reduce intake of potatoes, white bread, white pasta, and white rice. Look for whole grain options, oat flour or almond flour.  Each meal should contain half fruits/vegetables, one quarter protein, and one quarter carbs (no bigger than a computer mouse).  Cut down on sweet beverages. This includes juice, soda, and sweet tea. Also watch fruit intake, though this is a healthier sweet option, it still contains natural sugar! Limit to 3 servings daily.  Drink at least 1 glass of water with each meal and aim for at least 8 glasses (64 ounces) per day.  Exercise at least 150 minutes every week to the best of your ability.    Take Care,  Harper Smoker, PA-C

## 2021-08-20 DIAGNOSIS — J432 Centrilobular emphysema: Secondary | ICD-10-CM | POA: Insufficient documentation

## 2021-08-20 DIAGNOSIS — Z89511 Acquired absence of right leg below knee: Secondary | ICD-10-CM | POA: Insufficient documentation

## 2021-08-21 ENCOUNTER — Other Ambulatory Visit: Payer: Self-pay | Admitting: Physician Assistant

## 2021-08-28 ENCOUNTER — Emergency Department (HOSPITAL_BASED_OUTPATIENT_CLINIC_OR_DEPARTMENT_OTHER)
Admission: EM | Admit: 2021-08-28 | Discharge: 2021-08-28 | Disposition: A | Discharge status: Home / Self Care | Payer: BC Managed Care – PPO | Attending: Emergency Medicine | Admitting: Emergency Medicine

## 2021-08-28 ENCOUNTER — Other Ambulatory Visit: Payer: Self-pay

## 2021-08-28 ENCOUNTER — Encounter: Payer: Self-pay | Admitting: Physician Assistant

## 2021-08-28 ENCOUNTER — Emergency Department (HOSPITAL_BASED_OUTPATIENT_CLINIC_OR_DEPARTMENT_OTHER): Payer: BC Managed Care – PPO

## 2021-08-28 ENCOUNTER — Ambulatory Visit (INDEPENDENT_AMBULATORY_CARE_PROVIDER_SITE_OTHER): Payer: BC Managed Care – PPO | Admitting: Physician Assistant

## 2021-08-28 ENCOUNTER — Encounter (HOSPITAL_BASED_OUTPATIENT_CLINIC_OR_DEPARTMENT_OTHER): Payer: Self-pay

## 2021-08-28 VITALS — BP 148/92 | HR 79 | Temp 97.3°F | Ht 70.0 in | Wt 208.0 lb

## 2021-08-28 DIAGNOSIS — R601 Generalized edema: Secondary | ICD-10-CM

## 2021-08-28 DIAGNOSIS — R911 Solitary pulmonary nodule: Secondary | ICD-10-CM

## 2021-08-28 DIAGNOSIS — R109 Unspecified abdominal pain: Secondary | ICD-10-CM

## 2021-08-28 DIAGNOSIS — R140 Abdominal distension (gaseous): Secondary | ICD-10-CM | POA: Diagnosis not present

## 2021-08-28 DIAGNOSIS — M7989 Other specified soft tissue disorders: Secondary | ICD-10-CM | POA: Diagnosis not present

## 2021-08-28 DIAGNOSIS — R34 Anuria and oliguria: Secondary | ICD-10-CM | POA: Diagnosis not present

## 2021-08-28 DIAGNOSIS — R1084 Generalized abdominal pain: Secondary | ICD-10-CM | POA: Diagnosis not present

## 2021-08-28 DIAGNOSIS — D508 Other iron deficiency anemias: Secondary | ICD-10-CM | POA: Diagnosis not present

## 2021-08-28 DIAGNOSIS — F17200 Nicotine dependence, unspecified, uncomplicated: Secondary | ICD-10-CM

## 2021-08-28 DIAGNOSIS — M79605 Pain in left leg: Secondary | ICD-10-CM | POA: Diagnosis not present

## 2021-08-28 DIAGNOSIS — R600 Localized edema: Secondary | ICD-10-CM | POA: Diagnosis not present

## 2021-08-28 DIAGNOSIS — J432 Centrilobular emphysema: Secondary | ICD-10-CM

## 2021-08-28 LAB — COMPREHENSIVE METABOLIC PANEL
ALT: 15 U/L (ref 0–53)
AST: 16 U/L (ref 0–37)
Albumin: 4.5 g/dL (ref 3.5–5.2)
Alkaline Phosphatase: 65 U/L (ref 39–117)
BUN: 21 mg/dL (ref 6–23)
CO2: 23 mEq/L (ref 19–32)
Calcium: 9.4 mg/dL (ref 8.4–10.5)
Chloride: 104 mEq/L (ref 96–112)
Creatinine, Ser: 0.92 mg/dL (ref 0.40–1.50)
GFR: 94.6 mL/min (ref >60.00)
Glucose, Bld: 100 mg/dL — ABNORMAL HIGH (ref 70–99)
Potassium: 4 mEq/L (ref 3.5–5.1)
Sodium: 136 mEq/L (ref 135–145)
Total Bilirubin: 0.3 mg/dL (ref 0.2–1.2)
Total Protein: 7.4 g/dL (ref 6.0–8.3)

## 2021-08-28 LAB — CBC WITH DIFFERENTIAL/PLATELET
Basophils Absolute: 0.1 10*3/uL (ref 0.0–0.1)
Basophils Relative: 0.5 % (ref 0.0–3.0)
Eosinophils Absolute: 0.1 10*3/uL (ref 0.0–0.7)
Eosinophils Relative: 0.7 % (ref 0.0–5.0)
HCT: 36.1 % — ABNORMAL LOW (ref 39.0–52.0)
Hemoglobin: 11.2 g/dL — ABNORMAL LOW (ref 13.0–17.0)
Lymphocytes Relative: 14.1 % (ref 12.0–46.0)
Lymphs Abs: 1.6 10*3/uL (ref 0.7–4.0)
MCHC: 31.1 g/dL (ref 30.0–36.0)
MCV: 71 fl — ABNORMAL LOW (ref 78.0–100.0)
Monocytes Absolute: 0.5 10*3/uL (ref 0.1–1.0)
Monocytes Relative: 4.3 % (ref 3.0–12.0)
Neutro Abs: 9.2 10*3/uL — ABNORMAL HIGH (ref 1.4–7.7)
Neutrophils Relative %: 80.4 % — ABNORMAL HIGH (ref 43.0–77.0)
Platelets: 250 10*3/uL (ref 150.0–400.0)
RBC: 5.08 Mil/uL (ref 4.22–5.81)
RDW: 17.8 % — ABNORMAL HIGH (ref 11.5–15.5)
WBC: 11.4 10*3/uL — ABNORMAL HIGH (ref 4.0–10.5)

## 2021-08-28 LAB — POCT URINALYSIS DIPSTICK
Bilirubin, UA: NEGATIVE
Blood, UA: POSITIVE
Glucose, UA: NEGATIVE
Ketones, UA: NEGATIVE
Leukocytes, UA: NEGATIVE
Nitrite, UA: NEGATIVE
Protein, UA: NEGATIVE
Spec Grav, UA: >=1.03 — AB (ref 1.010–1.025)
Urobilinogen, UA: 0.2 E.U./dL
pH, UA: 5.5 (ref 5.0–8.0)

## 2021-08-28 LAB — IBC + FERRITIN
Ferritin: 5.7 ng/mL — ABNORMAL LOW (ref 22.0–322.0)
Iron: 17 ug/dL — ABNORMAL LOW (ref 42–165)
Saturation Ratios: 3.6 % — ABNORMAL LOW (ref 20.0–50.0)
TIBC: 469 ug/dL — ABNORMAL HIGH (ref 250.0–450.0)
Transferrin: 335 mg/dL (ref 212.0–360.0)

## 2021-08-28 LAB — BRAIN NATRIURETIC PEPTIDE: Pro B Natriuretic peptide (BNP): 15 pg/mL (ref 0.0–100.0)

## 2021-08-28 NOTE — ED Triage Notes (Signed)
Pt seen here about a week ago for LLE edema and abd distention. Pt recently placed on lasix at home. Pt seen by PCP today and sent here for evaluation of abd distention.

## 2021-08-28 NOTE — ED Notes (Signed)
Pt ambulated out of the room and left the facility without talking to staff. Wife stated that he was done and wanted to leave. Pt left AMA without talking to staff or the MD.
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
## 2021-08-30 NOTE — ED Provider Notes (Signed)
[facility] EMERGENCY DEPT Provider Note   CSN: [id number] Arrival date & time: 08/28/21  1615     History  Chief Complaint  Patient presents with   Leg Swelling    Eric Snyder is a 56 y.o. male.  Presenting to ER due to concern for leg swelling and distention in his abdomen.  He reports that he was placed on Lasix but this does not seem to have changed anything.  Patient states that he is not as worried about his symptoms as his family.  Says that he currently does not have any abdominal pain but does feel like his abdomen is more distended than normal.  Denies any pain in his legs.  HPI     Home Medications Prior to Admission medications   Medication Sig Start Date End Date Taking? Authorizing Provider  Ascorbic Acid (VITA-C PO) Take 2 each by mouth daily.    [provider]  Cholecalciferol (VITAMIN D-3 PO) Take 1 tablet by mouth daily.    [provider]  diclofenac (VOLTAREN) 25 MG EC tablet Take 25 mg by mouth 2 (two) times daily. Reported on 05/26/2015    [provider]  furosemide (LASIX) 20 MG tablet Take 1 tablet (20 mg total) by mouth 2 (two) times daily for 15 days. 08/19/21 09/03/21  Allwardt, Randa Evens, PA-C  naloxone (NARCAN) nasal spray 4 mg/0.1 mL Place 0.4 mg into the nose once as needed (opioid overdose).    [provider]  oxycodone (ROXICODONE) 30 MG immediate release tablet Take 30 mg by mouth 5 (five) times daily. scheduled 07/04/20   [provider]  oxymorphone (OPANA ER) 15 MG 12 hr tablet Take 15 mg by mouth 2 (two) times daily as needed. Patient not taking: Reported on 08/28/2021 08/24/21   [provider]  potassium chloride SA (KLOR-CON M) 20 MEQ tablet Take 1 tablet (20 mEq total) by mouth 2 (two) times daily. 08/19/21   Allwardt, Randa Evens, PA-C  predniSONE (DELTASONE) 10 MG tablet Take 4 tablets (40 mg total) by mouth daily. 08/16/21   Kommor, Debe Coder, MD      Allergies    Patient has no  known allergies.    Review of Systems   Review of Systems  Constitutional:  Negative for chills and fever.  HENT:  Negative for ear pain and sore throat.   Eyes:  Negative for pain and visual disturbance.  Respiratory:  Negative for cough and shortness of breath.   Cardiovascular:  Positive for leg swelling. Negative for chest pain and palpitations.  Gastrointestinal:  Positive for abdominal distention and abdominal pain. Negative for vomiting.  Genitourinary:  Negative for dysuria and hematuria.  Musculoskeletal:  Negative for arthralgias and back pain.  Skin:  Negative for color change and rash.  Neurological:  Negative for seizures and syncope.  All other systems reviewed and are negative.   Physical Exam Updated Vital Signs BP (!) 146/99 (BP Location: Right Arm)   Pulse 60   Temp 98.2 F (36.8 C) (Oral)   Resp 16   Ht '5\' 10"'$  (1.778 m)   Wt 93.4 kg   SpO2 93%   BMI 29.56 kg/m  Physical Exam Vitals and nursing note reviewed.  Constitutional:      General: He is not in acute distress.    Appearance: He is well-developed.  HENT:     Head: Normocephalic and atraumatic.  Eyes:     Conjunctiva/sclera: Conjunctivae normal.  Cardiovascular:     Rate and  Rhythm: Normal rate and regular rhythm.     Heart sounds: No murmur heard. Pulmonary:     Effort: Pulmonary effort is normal. No respiratory distress.     Breath sounds: Normal breath sounds.  Abdominal:     Tenderness: There is abdominal tenderness.     Comments: Mild generalized tenderness to palpation, some distention noted, no rebound or guarding  Musculoskeletal:        General: No swelling.     Cervical back: Neck supple.     Comments: Left lower extremity: Mild swelling to left leg, no significant deformity, no tenderness palpation throughout, normal DP and PT pulses, normal joint ROM  Skin:    General: Skin is warm and dry.     Capillary Refill: Capillary refill takes less than 2 seconds.  Neurological:      Mental Status: He is alert.  Psychiatric:        Mood and Affect: Mood normal.     ED Results / Procedures / Treatments   Labs (all labs ordered are listed, but only abnormal results are displayed) Labs Reviewed - No data to display  EKG None  Radiology No results found.  Procedures Procedures    Medications Ordered in ED Medications - No data to display  ED Course/ Medical Decision Making/ A&P                           Medical Decision Making  56 year old gentleman presenting to ER due to concern for abdominal distention, left leg swelling.  On exam well-appearing no distress.  I advised checking ultrasound to rule out DVT and CT scan of abdomen pelvis to further evaluate.  I reviewed lab work that was completed by his primary care doctor from earlier today.  Normal CMP, no transaminitis, no electrolyte derangement, mild leukocytosis on CBC.  Prior to patient getting either CT or ultrasound completed, patient eloped from ER, leaving [place].        Final Clinical Impression(s) / ED Diagnoses Final diagnoses:  Leg swelling  Abdominal discomfort    Rx / DC Orders ED Discharge Orders     None         Lucrezia Starch, MD 08/30/21 1614

## 2021-08-31 ENCOUNTER — Telehealth: Payer: Self-pay | Admitting: Physician Assistant

## 2021-08-31 NOTE — Telephone Encounter (Signed)
Order for CT was faxed to [facility] at number provided. Confirmation received

## 2021-08-31 NOTE — Telephone Encounter (Signed)
Eric Snyder with [employer] requests on behalf of Patient for an Order for CT to be faxed to [facility] in [place] Fax# [phone]

## 2021-09-01 ENCOUNTER — Ambulatory Visit (HOSPITAL_COMMUNITY)
Admission: RE | Admit: 2021-09-01 | Discharge: 2021-09-01 | Disposition: A | Discharge status: Home / Self Care | Payer: BC Managed Care – PPO | Source: Ambulatory Visit | Attending: Physician Assistant | Admitting: Physician Assistant

## 2021-09-01 DIAGNOSIS — R600 Localized edema: Secondary | ICD-10-CM | POA: Insufficient documentation

## 2021-09-01 DIAGNOSIS — K8689 Other specified diseases of pancreas: Secondary | ICD-10-CM | POA: Diagnosis not present

## 2021-09-01 DIAGNOSIS — R601 Generalized edema: Secondary | ICD-10-CM | POA: Insufficient documentation

## 2021-09-01 DIAGNOSIS — R140 Abdominal distension (gaseous): Secondary | ICD-10-CM | POA: Diagnosis not present

## 2021-09-01 MED ORDER — SODIUM CHLORIDE (PF) 0.9 % IJ SOLN
INTRAMUSCULAR | Status: AC
  Filled 2021-09-01: qty 50

## 2021-09-01 MED ORDER — IOHEXOL 300 MG/ML  SOLN
100.0000 mL | Freq: Once | INTRAMUSCULAR | Status: AC | PRN
Start: 2021-09-01 — End: 2021-09-01
  Administered 2021-09-01: 100 mL via INTRAVENOUS

## 2021-09-04 ENCOUNTER — Encounter: Payer: Self-pay | Admitting: Physician Assistant

## 2021-09-04 ENCOUNTER — Telehealth (INDEPENDENT_AMBULATORY_CARE_PROVIDER_SITE_OTHER): Payer: BC Managed Care – PPO | Admitting: Physician Assistant

## 2021-09-04 VITALS — Ht 70.0 in | Wt 206.0 lb

## 2021-09-04 DIAGNOSIS — K8689 Other specified diseases of pancreas: Secondary | ICD-10-CM

## 2021-09-04 DIAGNOSIS — R601 Generalized edema: Secondary | ICD-10-CM | POA: Diagnosis not present

## 2021-09-04 DIAGNOSIS — R3121 Asymptomatic microscopic hematuria: Secondary | ICD-10-CM

## 2021-09-04 DIAGNOSIS — F17200 Nicotine dependence, unspecified, uncomplicated: Secondary | ICD-10-CM

## 2021-09-04 DIAGNOSIS — R600 Localized edema: Secondary | ICD-10-CM

## 2021-09-04 DIAGNOSIS — R140 Abdominal distension (gaseous): Secondary | ICD-10-CM | POA: Diagnosis not present

## 2021-09-04 DIAGNOSIS — J432 Centrilobular emphysema: Secondary | ICD-10-CM | POA: Diagnosis not present

## 2021-09-04 DIAGNOSIS — I700 Atherosclerosis of aorta: Secondary | ICD-10-CM

## 2021-09-04 NOTE — Progress Notes (Signed)
   Virtual Visit via Video Note  I connected with  Eric Snyder  on 09/04/21 at 11:45 AM EDT by a video enabled telemedicine application and verified that I am speaking with the correct person using two identifiers.  Location: Patient: home Provider: Therapist, music at [facility] present: Patient, his wife, and myself   I discussed the limitations of evaluation and management by telemedicine and the availability of in person appointments. The patient expressed understanding and agreed to proceed.   History of Present Illness: 56 yo male presents for virtual visit to revisit f/up from last week and to go over lab work.   "Feeling a lot better" - still noticing some swelling in LLE; sleeping with leg elevated and thinks this is helping.  No pain or redness or worsening swelling in LLE. ECHO and doppler US scheduled for 09/11/21.  Less swelling in abdomen per patient. Has used the bathroom, feels less constipated now. Urinating more as well.   Still taking Lasix 20 mg BID with potassium tablet twice daily.   No SOB or CP. No N/V/D.   Reviewed CT abd/pelvis from 09/01/21 again:  -Emphysema - has only ever needed intermittent breathing treatments. Has never seen pulmonary group.  -Aortic Atherosclerosis - first time he's ever heard this. Not sure he's ever had lipid panel checked. Not on statin.  -Microscopic hematuria - informed we'll need to repeat UA, urine culture, and urine microscopy, consider referral to urology; smoking since about age 70. 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   Assessment and Plan:  Abdominal distension Edema of left lower leg Generalized edema ---Continue on Lasix 20 mg BID and potassium supplement as directed ---ECHO and doppler US coming up soon ---Understands low threshold for ED if worse or no improvement of symptoms  Centrilobular emphysema ([place]) ----Recommend consult with pulmonary group; pt denies any symptoms though and wants to wait on this  Aortic atherosclerosis ([place]) ----Per stated on CT. Needs to be on a statin. Again, wants to wait on this for now.  Asymptomatic microscopic hematuria ----Need to repeat urine, culture, and add urine cytology for closer look; addressed my concern with him here due to long-term smoking history  Pancreatic atrophy -----Follow up with GI would be advised  Nicotine dependence, uncomplicated, unspecified nicotine product type ----Patient is thinking about quitting   Follow Up Instructions:    I discussed the assessment and treatment plan with the patient. The patient was provided an opportunity to ask questions and all were answered. The patient agreed with the plan and demonstrated an understanding of the instructions.   The patient was advised to call back or seek an in-person evaluation if the symptoms worsen or if the condition fails to improve as anticipated.  Jezebel Pollet M Samik Balkcom, PA-C

## 2021-09-09 ENCOUNTER — Telehealth: Payer: Self-pay | Admitting: Physician Assistant

## 2021-09-09 NOTE — Telephone Encounter (Signed)
Melissa with [facility] ph#  [phone], ext# [phone] Preservice states a PA for Echo Cardiogram is needed for Patient's appointment scheduled for 09/11/21. Melissa states it is needed asap-should be done 48 hours prior to appointment.  Melissa requests to be called at the ph# listed above for status of PA.

## 2021-09-10 DIAGNOSIS — I700 Atherosclerosis of aorta: Secondary | ICD-10-CM | POA: Insufficient documentation

## 2021-09-10 DIAGNOSIS — K8689 Other specified diseases of pancreas: Secondary | ICD-10-CM | POA: Insufficient documentation

## 2021-09-10 DIAGNOSIS — R3121 Asymptomatic microscopic hematuria: Secondary | ICD-10-CM | POA: Insufficient documentation

## 2021-09-10 NOTE — Patient Instructions (Signed)
Very low threshold to return to the ED if worse or any change in symptoms.

## 2021-09-11 ENCOUNTER — Ambulatory Visit (HOSPITAL_BASED_OUTPATIENT_CLINIC_OR_DEPARTMENT_OTHER)
Admission: RE | Admit: 2021-09-11 | Discharge: 2021-09-11 | Disposition: A | Discharge status: Home / Self Care | Payer: BC Managed Care – PPO | Source: Ambulatory Visit | Attending: Physician Assistant | Admitting: Physician Assistant

## 2021-09-11 ENCOUNTER — Ambulatory Visit (HOSPITAL_COMMUNITY)
Admission: RE | Admit: 2021-09-11 | Discharge: 2021-09-11 | Disposition: A | Discharge status: Home / Self Care | Payer: BC Managed Care – PPO | Source: Ambulatory Visit | Attending: Physician Assistant | Admitting: Physician Assistant

## 2021-09-11 DIAGNOSIS — F17200 Nicotine dependence, unspecified, uncomplicated: Secondary | ICD-10-CM | POA: Insufficient documentation

## 2021-09-11 DIAGNOSIS — R140 Abdominal distension (gaseous): Secondary | ICD-10-CM | POA: Diagnosis not present

## 2021-09-11 DIAGNOSIS — R601 Generalized edema: Secondary | ICD-10-CM | POA: Diagnosis not present

## 2021-09-11 DIAGNOSIS — R0609 Other forms of dyspnea: Secondary | ICD-10-CM | POA: Insufficient documentation

## 2021-09-11 DIAGNOSIS — R600 Localized edema: Secondary | ICD-10-CM | POA: Diagnosis not present

## 2021-09-11 LAB — ECHOCARDIOGRAM COMPLETE
Area-P 1/2: 2.46 cm2
S' Lateral: 2.7 cm

## 2021-09-15 ENCOUNTER — Telehealth: Payer: Self-pay | Admitting: Physician Assistant

## 2021-09-15 NOTE — Telephone Encounter (Signed)
Patient states he was calling back Alyssa's nurse in regards to his labs. Patient requests a call back at nurse's earliest convenience. He can be reached at [phone].

## 2021-09-16 NOTE — Telephone Encounter (Signed)
See lab notes

## 2021-09-17 ENCOUNTER — Ambulatory Visit (INDEPENDENT_AMBULATORY_CARE_PROVIDER_SITE_OTHER): Payer: BC Managed Care – PPO | Admitting: Physician Assistant

## 2021-09-17 ENCOUNTER — Encounter: Payer: Self-pay | Admitting: Physician Assistant

## 2021-09-17 VITALS — BP 148/90 | HR 82 | Temp 98.3°F | Ht 70.0 in | Wt 207.4 lb

## 2021-09-17 DIAGNOSIS — D72829 Elevated white blood cell count, unspecified: Secondary | ICD-10-CM | POA: Diagnosis not present

## 2021-09-17 DIAGNOSIS — R601 Generalized edema: Secondary | ICD-10-CM

## 2021-09-17 DIAGNOSIS — R600 Localized edema: Secondary | ICD-10-CM | POA: Diagnosis not present

## 2021-09-17 DIAGNOSIS — R3121 Asymptomatic microscopic hematuria: Secondary | ICD-10-CM

## 2021-09-17 DIAGNOSIS — D508 Other iron deficiency anemias: Secondary | ICD-10-CM

## 2021-09-17 DIAGNOSIS — F17200 Nicotine dependence, unspecified, uncomplicated: Secondary | ICD-10-CM

## 2021-09-17 MED ORDER — FUROSEMIDE 20 MG PO TABS: 20.0000 mg | ORAL_TABLET | Freq: Every day | ORAL | 1 refills | Status: DC

## 2021-09-17 MED ORDER — POTASSIUM CHLORIDE CRYS ER 20 MEQ PO TBCR: 20.0000 meq | EXTENDED_RELEASE_TABLET | Freq: Every day | ORAL | 1 refills | Status: AC

## 2021-09-17 NOTE — Patient Instructions (Addendum)
Good to see you!  Labs and urine today.  Please check FOB test to check for microscopic blood in stool.  Refilled Lasix to take once daily or once every other day based on swelling. Take with potassium.  Compression sock while up and working.  Keep working on smoking cessation!
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
## 2021-09-18 LAB — URINE CULTURE
MICRO NUMBER:: 13702983
Result:: NO GROWTH
SPECIMEN QUALITY:: ADEQUATE

## 2021-09-18 LAB — CBC WITH DIFFERENTIAL/PLATELET
Basophils Absolute: 0.1 10*3/uL (ref 0.0–0.1)
Basophils Relative: 1.6 % (ref 0.0–3.0)
Eosinophils Absolute: 0.2 10*3/uL (ref 0.0–0.7)
Eosinophils Relative: 2.5 % (ref 0.0–5.0)
HCT: 36.6 % — ABNORMAL LOW (ref 39.0–52.0)
Hemoglobin: 11.7 g/dL — ABNORMAL LOW (ref 13.0–17.0)
Lymphocytes Relative: 35.8 % (ref 12.0–46.0)
Lymphs Abs: 2.9 10*3/uL (ref 0.7–4.0)
MCHC: 32 g/dL (ref 30.0–36.0)
MCV: 72.7 fl — ABNORMAL LOW (ref 78.0–100.0)
Monocytes Absolute: 0.4 10*3/uL (ref 0.1–1.0)
Monocytes Relative: 5.5 % (ref 3.0–12.0)
Neutro Abs: 4.4 10*3/uL (ref 1.4–7.7)
Neutrophils Relative %: 54.6 % (ref 43.0–77.0)
Platelets: 273 10*3/uL (ref 150.0–400.0)
RBC: 5.04 Mil/uL (ref 4.22–5.81)
RDW: 20 % — ABNORMAL HIGH (ref 11.5–15.5)
WBC: 8.1 10*3/uL (ref 4.0–10.5)

## 2021-09-18 LAB — URINALYSIS, ROUTINE W REFLEX MICROSCOPIC
Hgb urine dipstick: NEGATIVE
Ketones, ur: NEGATIVE
Leukocytes,Ua: NEGATIVE
Nitrite: NEGATIVE
Specific Gravity, Urine: >=1.03 — AB (ref 1.000–1.030)
Total Protein, Urine: NEGATIVE
Urine Glucose: NEGATIVE
Urobilinogen, UA: 1 (ref 0.0–1.0)
pH: 5 (ref 5.0–8.0)

## 2021-09-18 LAB — BASIC METABOLIC PANEL WITH GFR
BUN: 19 mg/dL (ref 6–23)
CO2: 22 meq/L (ref 19–32)
Calcium: 9.3 mg/dL (ref 8.4–10.5)
Chloride: 109 meq/L (ref 96–112)
Creatinine, Ser: 1.07 mg/dL (ref 0.40–1.50)
GFR: 78.88 mL/min
Glucose, Bld: 76 mg/dL (ref 70–99)
Potassium: 4.1 meq/L (ref 3.5–5.1)
Sodium: 140 meq/L (ref 135–145)

## 2021-09-18 LAB — PATHOLOGIST SMEAR REVIEW

## 2021-09-21 ENCOUNTER — Encounter: Payer: Self-pay | Admitting: Registered Nurse

## 2021-09-21 ENCOUNTER — Ambulatory Visit (INDEPENDENT_AMBULATORY_CARE_PROVIDER_SITE_OTHER): Payer: BC Managed Care – PPO | Admitting: Registered Nurse

## 2021-09-21 ENCOUNTER — Other Ambulatory Visit: Payer: Self-pay

## 2021-09-21 VITALS — BP 128/70 | HR 72 | Temp 98.1°F | Resp 18 | Ht 70.0 in | Wt 209.3 lb

## 2021-09-21 DIAGNOSIS — M7989 Other specified soft tissue disorders: Secondary | ICD-10-CM

## 2021-09-21 NOTE — Patient Instructions (Addendum)
Mr. And Mrs. Lundstrom -   Stay well!  Compression stockings, low sodium diet  Continue to follow with orthopedics  Thank you,  Rich
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
## 2021-11-16 ENCOUNTER — Encounter: Payer: Self-pay | Admitting: *Deleted

## 2021-11-16 DIAGNOSIS — M5136 Other intervertebral disc degeneration, lumbar region: Secondary | ICD-10-CM | POA: Diagnosis not present

## 2021-11-16 DIAGNOSIS — G8929 Other chronic pain: Secondary | ICD-10-CM | POA: Diagnosis not present

## 2021-11-25 DIAGNOSIS — L578 Other skin changes due to chronic exposure to nonionizing radiation: Secondary | ICD-10-CM | POA: Diagnosis not present

## 2021-11-25 DIAGNOSIS — C44329 Squamous cell carcinoma of skin of other parts of face: Secondary | ICD-10-CM | POA: Diagnosis not present

## 2021-11-25 DIAGNOSIS — L814 Other melanin hyperpigmentation: Secondary | ICD-10-CM | POA: Diagnosis not present

## 2021-11-25 DIAGNOSIS — L570 Actinic keratosis: Secondary | ICD-10-CM | POA: Diagnosis not present

## 2021-12-24 DIAGNOSIS — T8733 Neuroma of amputation stump, right lower extremity: Secondary | ICD-10-CM | POA: Diagnosis not present

## 2021-12-31 DIAGNOSIS — C44319 Basal cell carcinoma of skin of other parts of face: Secondary | ICD-10-CM | POA: Diagnosis not present

## 2022-01-12 DIAGNOSIS — T8733 Neuroma of amputation stump, right lower extremity: Secondary | ICD-10-CM | POA: Diagnosis not present

## 2022-01-13 DIAGNOSIS — L089 Local infection of the skin and subcutaneous tissue, unspecified: Secondary | ICD-10-CM | POA: Diagnosis not present

## 2022-01-26 DIAGNOSIS — T8733 Neuroma of amputation stump, right lower extremity: Secondary | ICD-10-CM | POA: Diagnosis not present

## 2022-02-02 DIAGNOSIS — M86461 Chronic osteomyelitis with draining sinus, right tibia and fibula: Secondary | ICD-10-CM | POA: Diagnosis not present

## 2022-02-02 DIAGNOSIS — M869 Osteomyelitis, unspecified: Secondary | ICD-10-CM | POA: Insufficient documentation

## 2022-02-04 ENCOUNTER — Encounter: Payer: Self-pay | Admitting: *Deleted

## 2022-02-09 DIAGNOSIS — D509 Iron deficiency anemia, unspecified: Secondary | ICD-10-CM | POA: Insufficient documentation

## 2022-02-09 DIAGNOSIS — F17200 Nicotine dependence, unspecified, uncomplicated: Secondary | ICD-10-CM | POA: Insufficient documentation

## 2022-02-09 DIAGNOSIS — Z01818 Encounter for other preprocedural examination: Secondary | ICD-10-CM | POA: Insufficient documentation

## 2022-02-10 DIAGNOSIS — Z79891 Long term (current) use of opiate analgesic: Secondary | ICD-10-CM | POA: Diagnosis not present

## 2022-02-10 DIAGNOSIS — Z85828 Personal history of other malignant neoplasm of skin: Secondary | ICD-10-CM | POA: Diagnosis not present

## 2022-02-10 DIAGNOSIS — Z981 Arthrodesis status: Secondary | ICD-10-CM | POA: Diagnosis not present

## 2022-02-10 DIAGNOSIS — M1990 Unspecified osteoarthritis, unspecified site: Secondary | ICD-10-CM | POA: Diagnosis not present

## 2022-02-10 DIAGNOSIS — D509 Iron deficiency anemia, unspecified: Secondary | ICD-10-CM | POA: Diagnosis not present

## 2022-02-10 DIAGNOSIS — Z7952 Long term (current) use of systemic steroids: Secondary | ICD-10-CM | POA: Diagnosis not present

## 2022-02-10 DIAGNOSIS — Z428 Encounter for other plastic and reconstructive surgery following medical procedure or healed injury: Secondary | ICD-10-CM | POA: Diagnosis not present

## 2022-02-10 DIAGNOSIS — Z79899 Other long term (current) drug therapy: Secondary | ICD-10-CM | POA: Diagnosis not present

## 2022-02-10 DIAGNOSIS — T8789 Other complications of amputation stump: Secondary | ICD-10-CM | POA: Diagnosis not present

## 2022-02-10 DIAGNOSIS — M86461 Chronic osteomyelitis with draining sinus, right tibia and fibula: Secondary | ICD-10-CM | POA: Diagnosis not present

## 2022-02-10 DIAGNOSIS — Y835 Amputation of limb(s) as the cause of abnormal reaction of the patient, or of later complication, without mention of misadventure at the time of the procedure: Secondary | ICD-10-CM | POA: Diagnosis not present

## 2022-02-10 DIAGNOSIS — F17210 Nicotine dependence, cigarettes, uncomplicated: Secondary | ICD-10-CM | POA: Diagnosis not present

## 2022-02-10 DIAGNOSIS — T8733 Neuroma of amputation stump, right lower extremity: Secondary | ICD-10-CM | POA: Diagnosis not present

## 2022-02-16 ENCOUNTER — Telehealth: Payer: Self-pay | Admitting: Physician Assistant

## 2022-02-16 NOTE — Telephone Encounter (Signed)
Transition Care Management Unsuccessful Follow-up Telephone Call  Date of discharge and from where:  [facility] on 02/11/22  Attempts:  1st Attempt  Reason for unsuccessful TCM follow-up call:  Left voice message

## 2022-02-19 ENCOUNTER — Telehealth: Payer: Self-pay

## 2022-02-19 NOTE — Telephone Encounter (Signed)
Transition Care Management Unsuccessful Follow-up Telephone Call  Date of discharge and from where:    Attempts:  2nd Attempt  Reason for unsuccessful TCM follow-up call:  Left voice message

## 2022-02-19 NOTE — Telephone Encounter (Signed)
Transition Care Management Unsuccessful Follow-up Telephone Call  Date of discharge and from where:  02/12/2022 from [facility]  Attempts:  3rd Attempt  Reason for unsuccessful TCM follow-up call:  Unable to reach patient

## 2022-03-08 DIAGNOSIS — G8929 Other chronic pain: Secondary | ICD-10-CM | POA: Diagnosis not present

## 2022-03-26 ENCOUNTER — Telehealth: Payer: Self-pay | Admitting: Physician Assistant

## 2022-03-26 NOTE — Telephone Encounter (Signed)
Patient is scheduled for MyChart Virtual visit 03/29/22.  Requests RX/Order for a wheelchair be sent to [facility] located on [address] [place].

## 2022-03-26 NOTE — Telephone Encounter (Signed)
Please see call note and advise

## 2022-03-29 ENCOUNTER — Telehealth (INDEPENDENT_AMBULATORY_CARE_PROVIDER_SITE_OTHER): Payer: Medicaid Other | Admitting: Physician Assistant

## 2022-03-29 ENCOUNTER — Encounter: Payer: Self-pay | Admitting: Physician Assistant

## 2022-03-29 VITALS — Ht 70.0 in | Wt 209.0 lb

## 2022-03-29 DIAGNOSIS — T8733 Neuroma of amputation stump, right lower extremity: Secondary | ICD-10-CM

## 2022-03-29 DIAGNOSIS — Z89511 Acquired absence of right leg below knee: Secondary | ICD-10-CM

## 2022-03-29 DIAGNOSIS — M86661 Other chronic osteomyelitis, right tibia and fibula: Secondary | ICD-10-CM | POA: Diagnosis not present
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
## 2022-05-13 DIAGNOSIS — M792 Neuralgia and neuritis, unspecified: Secondary | ICD-10-CM | POA: Diagnosis not present

## 2022-05-27 DIAGNOSIS — L03011 Cellulitis of right finger: Secondary | ICD-10-CM | POA: Diagnosis not present

## 2022-05-31 DIAGNOSIS — G8929 Other chronic pain: Secondary | ICD-10-CM | POA: Diagnosis not present

## 2022-06-03 DIAGNOSIS — M86461 Chronic osteomyelitis with draining sinus, right tibia and fibula: Secondary | ICD-10-CM | POA: Diagnosis not present

## 2022-07-01 ENCOUNTER — Telehealth (INDEPENDENT_AMBULATORY_CARE_PROVIDER_SITE_OTHER): Payer: Medicaid Other | Admitting: Physician Assistant

## 2022-07-01 VITALS — Ht 70.0 in | Wt 209.0 lb

## 2022-07-01 DIAGNOSIS — Z7689 Persons encountering health services in other specified circumstances: Secondary | ICD-10-CM

## 2022-07-01 DIAGNOSIS — Z89511 Acquired absence of right leg below knee: Secondary | ICD-10-CM

## 2022-07-01 DIAGNOSIS — F1190 Opioid use, unspecified, uncomplicated: Secondary | ICD-10-CM | POA: Diagnosis not present

## 2022-07-01 DIAGNOSIS — M62838 Other muscle spasm: Secondary | ICD-10-CM

## 2022-07-01 DIAGNOSIS — T8733 Neuroma of amputation stump, right lower extremity: Secondary | ICD-10-CM

## 2022-07-01 MED ORDER — CYCLOBENZAPRINE HCL 10 MG PO TABS: 10.0000 mg | ORAL_TABLET | Freq: Two times a day (BID) | ORAL | 2 refills | Status: AC

## 2022-07-01 NOTE — Assessment & Plan Note (Signed)
Followed with Dr. Lenoria Chime PDMP reviewed today, no red flags, filling appropriately.

## 2022-07-01 NOTE — Assessment & Plan Note (Signed)
Informed we need a diagnosis prior to attempting treatment. Referral to sleep studies. Could be secondary to chronic opioid use. Advised against driving at this time.

## 2022-07-01 NOTE — Assessment & Plan Note (Signed)
Following with Dr. Louanne Skye
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
## 2022-07-01 NOTE — Assessment & Plan Note (Signed)
Secondary to R BKA stump nerve / muscle chronic issues. Did well with Flexeril 10 mg previously. Refilled to take this twice daily as needed. Will f/up in 3-4 weeks to see how he's doing.
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
## 2022-07-26 DIAGNOSIS — R202 Paresthesia of skin: Secondary | ICD-10-CM | POA: Diagnosis not present

## 2022-07-26 DIAGNOSIS — G894 Chronic pain syndrome: Secondary | ICD-10-CM | POA: Diagnosis not present

## 2022-11-05 ENCOUNTER — Encounter (HOSPITAL_BASED_OUTPATIENT_CLINIC_OR_DEPARTMENT_OTHER): Payer: Self-pay | Admitting: Emergency Medicine

## 2022-11-05 ENCOUNTER — Emergency Department (HOSPITAL_BASED_OUTPATIENT_CLINIC_OR_DEPARTMENT_OTHER): Payer: Medicaid Other

## 2022-11-05 ENCOUNTER — Other Ambulatory Visit: Payer: Self-pay

## 2022-11-05 ENCOUNTER — Emergency Department (HOSPITAL_BASED_OUTPATIENT_CLINIC_OR_DEPARTMENT_OTHER)
Admission: EM | Admit: 2022-11-05 | Discharge: 2022-11-05 | Disposition: A | Discharge status: Home / Self Care | Payer: Medicaid Other | Attending: Emergency Medicine | Admitting: Emergency Medicine

## 2022-11-05 DIAGNOSIS — W230XXA Caught, crushed, jammed, or pinched between moving objects, initial encounter: Secondary | ICD-10-CM | POA: Diagnosis not present

## 2022-11-05 DIAGNOSIS — S61217A Laceration without foreign body of left little finger without damage to nail, initial encounter: Secondary | ICD-10-CM | POA: Diagnosis not present

## 2022-11-05 DIAGNOSIS — Z23 Encounter for immunization: Secondary | ICD-10-CM | POA: Insufficient documentation

## 2022-11-05 DIAGNOSIS — S61317S Laceration without foreign body of left little finger with damage to nail, sequela: Secondary | ICD-10-CM

## 2022-11-05 DIAGNOSIS — S6992XA Unspecified injury of left wrist, hand and finger(s), initial encounter: Secondary | ICD-10-CM | POA: Diagnosis present

## 2022-11-05 MED ORDER — FENTANYL CITRATE PF 50 MCG/ML IJ SOSY
100.0000 ug | PREFILLED_SYRINGE | Freq: Once | INTRAMUSCULAR | Status: AC
  Administered 2022-11-05: 100 ug via INTRAMUSCULAR
  Filled 2022-11-05: qty 2

## 2022-11-05 MED ORDER — LIDOCAINE HCL (PF) 1 % IJ SOLN
30.0000 mL | Freq: Once | INTRAMUSCULAR | Status: AC
Start: 2022-11-05 — End: 2022-11-05
  Administered 2022-11-05: 30 mL
  Filled 2022-11-05: qty 30

## 2022-11-05 MED ORDER — OXYCODONE-ACETAMINOPHEN 5-325 MG PO TABS
1.0000 | ORAL_TABLET | Freq: Once | ORAL | Status: AC
  Administered 2022-11-05: 1 via ORAL
  Filled 2022-11-05: qty 1

## 2022-11-05 MED ORDER — AMOXICILLIN-POT CLAVULANATE 875-125 MG PO TABS: 1.0000 | ORAL_TABLET | Freq: Two times a day (BID) | ORAL | 0 refills | Status: AC

## 2022-11-05 MED ORDER — CEFAZOLIN SODIUM-DEXTROSE 1-4 GM/50ML-% IV SOLN
1.0000 g | Freq: Once | INTRAVENOUS | Status: AC
  Administered 2022-11-05: 1 g via INTRAVENOUS
  Filled 2022-11-05: qty 50

## 2022-11-05 MED ORDER — TETANUS-DIPHTH-ACELL PERTUSSIS 5-2.5-18.5 LF-MCG/0.5 IM SUSY
0.5000 mL | PREFILLED_SYRINGE | Freq: Once | INTRAMUSCULAR | Status: AC
  Administered 2022-11-05: 0.5 mL via INTRAMUSCULAR
  Filled 2022-11-05: qty 0.5

## 2022-11-05 MED ORDER — OXYCODONE-ACETAMINOPHEN 5-325 MG PO TABS: 1.0000 | ORAL_TABLET | Freq: Four times a day (QID) | ORAL | 0 refills | Status: AC | PRN

## 2022-11-05 NOTE — Discharge Instructions (Addendum)
You must get your sutures removed in 7-10 days. We recommend visiting your PCP or an urgent care for suture removal. However, you may also return back to the ER if you are unable to be seen by your PCP or at urgent care.   You may gently clean the area around your laceration as needed with soap and water. Place antibiotic ointment such as bacitracin or neosporin over your laceration after cleaning the area.  Keep the tip of your finger covered with the nonstick gauze as shown here and then wrapped with gauze to protect the area. Change this dressing daily or sooner as needed. You may apply a finger splint as needed to protect the area.  You may pick these supplies up at any drugstore.  Do not submerge your laceration in water (no baths, swimming) until it is fully healed. You may shower.   You have been prescribed Augmentin. Take this antibiotic 2 times a day for the next 10 days. Take the full course of your antibiotic even if you start feeling better. Antibiotics may cause you to have diarrhea.  For pain, take 800mg  ibuprofen, then 3 hours later take 1000mg  Tylenol, 3 hours later take 800 mg ibuprofen, etc.   You have been prescribed Percocet-this is a narcotic/controlled substance medication that has potential addicting qualities.  You may take 1 tablet every 8 hours as needed for severe pain that is not controlled with Tylenol, ibuprofen, and your home oxycodone.  Do not drive or operate heavy machinery when taking this medicine as it can be sedating. Do not drink alcohol or take other sedating medications when taking this medicine for safety reasons.  Keep this out of reach of small children.  Please be aware this medicine has Tylenol in it (325 mg/tab) do not exceed the maximum dose of Tylenol in a day per over the counter recommendations should you decide to supplement with Tylenol over the counter.    Return to the ER should you develop fever, chills, pus drainage from your wound, redness  around your wound.

## 2022-11-05 NOTE — ED Provider Notes (Signed)
[place] EMERGENCY DEPARTMENT AT [facility] Provider Note   CSN: [id number] Arrival date & time: 11/05/22  1607     History  Chief Complaint  Patient presents with   Hand Injury    Eric Snyder is a 57 y.o. male who presents with concern for left pinky finger injury.  He was changing a tire and his hand got crushed between the car and the guard rail.  Had immediate bleeding and pain of his pinky finger and wrapped to the area.  He took 1 oxycodone approximately 3 hours prior to arrival for pain.  Denies any numbness or tingling in his hand, able to move his hand. Unknown when last tetanus was updated.   Hand Injury      Home Medications Prior to Admission medications   Medication Sig Start Date End Date Taking? Authorizing Provider  amoxicillin-clavulanate (AUGMENTIN) 875-125 MG tablet Take 1 tablet by mouth every 12 (twelve) hours for 10 days. 11/05/22 11/15/22 Yes Arabella Merles, PA-C  oxyCODONE-acetaminophen (PERCOCET/ROXICET) 5-325 MG tablet Take 1 tablet by mouth every 6 (six) hours as needed for up to 5 days for severe pain (If pain not controlled with tylenol and ibuprofen). 11/05/22 11/10/22 Yes Arabella Merles, PA-C  Ascorbic Acid (VITA-C PO) Take 2 each by mouth daily.    [provider]  Cholecalciferol (VITAMIN D-3 PO) Take 1 tablet by mouth daily.    [provider]  diclofenac (VOLTAREN) 25 MG EC tablet Take 25 mg by mouth 2 (two) times daily. Reported on 05/26/2015    [provider]  furosemide (LASIX) 20 MG tablet Take 1 tablet (20 mg total) by mouth daily. Patient not taking: Reported on 07/01/2022 09/17/21   Allwardt, Crist Infante, PA-C  naloxone [facility]) nasal spray 4 mg/0.1 mL Place 0.4 mg into the nose once as needed (opioid overdose).    [provider]  oxycodone (ROXICODONE) 30 MG immediate release tablet Take 30 mg by mouth 5 (five) times daily. scheduled 07/04/20   [provider]  potassium chloride SA  (KLOR-CON M) 20 MEQ tablet Take 1 tablet (20 mEq total) by mouth daily. Patient not taking: Reported on 07/01/2022 09/17/21   Allwardt, Crist Infante, PA-C      Allergies    Patient has no known allergies.    Review of Systems   Review of Systems  Skin:  Positive for wound.    Physical Exam Updated Vital Signs BP (!) 151/83 (BP Location: Right Arm)   Pulse 78   Temp 97.6 F (36.4 C) (Oral)   Resp 18   SpO2 97%  Physical Exam Vitals and nursing note reviewed.  Constitutional:      Appearance: Normal appearance.  HENT:     Head: Atraumatic.  Cardiovascular:     Comments: 2+ radial pulse bilaterally Pulmonary:     Effort: Pulmonary effort is normal.  Musculoskeletal:     Comments: Abrasion to the dorsal aspect of the left hand over the fourth metacarpal, approximately 4 cm long.  Bleeding well-controlled, no obvious debris  Left pinky finger missing fingernail, distal tip of the finger is bloodied soft tissue.  Laceration approximately 1.5 extending from the medial edge of DIP the left pinky out to the end of the pinky finger  Able to flex and extend at the MCP, PIP, DIP of the left hand 1st through 5th digits.  Sensation intact in the left pinky finger  Neurological:     General: No focal deficit present.  Mental Status: He is alert.  Psychiatric:        Mood and Affect: Mood normal.        Behavior: Behavior normal.        After sutures:    ED Results / Procedures / Treatments   Labs (all labs ordered are listed, but only abnormal results are displayed) Labs Reviewed - No data to display  EKG None  Radiology DG Hand Complete Left  Result Date: 11/05/2022 CLINICAL DATA:  Injury to left pinky EXAM: LEFT HAND - COMPLETE 4 VIEW COMPARISON:  None Available. FINDINGS: Soft tissue defect along the distal tuft of the distal phalanx of fifth digit with a subtle cortical irregularity of the distal tuft of the distal phalanx itself. Subtle fracture. No additional  fracture or dislocation. Mild degenerative changes of the distal interphalangeal joints of second and third digits. Negative ulnar variance at the edge of the imaging field. IMPRESSION: Soft tissue injury of the distal tip of fifth digit with subtle cortical irregularity along the distal tuft of the distal phalanx of the fifth digit. Additional subtle bony injury. Electronically Signed   By: Karen Kays M.D.   On: 11/05/2022 17:51    Procedures .Marland KitchenLaceration Repair  Date/Time: 11/05/2022 7:31 PM  Performed by: Arabella Merles, PA-C Authorized by: Arabella Merles, PA-C   Consent:    Consent obtained:  Verbal   Consent given by:  Patient   Risks, benefits, and alternatives were discussed: yes     Risks discussed:  Infection, pain, poor cosmetic result, nerve damage and poor wound healing   Alternatives discussed:  No treatment Universal protocol:    Procedure explained and questions answered to patient or proxy's satisfaction: yes     Imaging studies available: yes     Patient identity confirmed:  Verbally with patient Anesthesia:    Anesthesia method:  Nerve block   Block location:  Left pinky finger digital block   Block anesthetic:  Lidocaine 1% w/o epi (6ml)   Block injection procedure:  Anatomic landmarks identified, introduced needle, incremental injection and negative aspiration for blood   Block outcome:  Anesthesia achieved Laceration details:    Location:  Finger   Finger location:  L small finger   Length (cm):  2 Pre-procedure details:    Preparation:  Imaging obtained to evaluate for foreign bodies Exploration:    Imaging obtained: x-ray     Imaging outcome: foreign body not noted     Contaminated: no   Treatment:    Area cleansed with:  Povidone-iodine   Amount of cleaning:  Extensive   Irrigation solution:  Sterile saline   Irrigation volume:  2L   Irrigation method:  Syringe   Undermining:  Extensive Skin repair:    Repair method:  Sutures   Suture size:   3-0   Suture material:  Prolene   Suture technique:  Simple interrupted   Number of sutures:  8 Repair type:    Repair type:  Complex Post-procedure details:    Dressing:  Non-adherent dressing, antibiotic ointment, sterile dressing and bulky dressing   Procedure completion:  Tolerated well, no immediate complications     Medications Ordered in ED Medications  fentaNYL (SUBLIMAZE) injection 100 mcg (100 mcg Intramuscular Given 11/05/22 1647)  lidocaine (PF) (XYLOCAINE) 1 % injection 30 mL (30 mLs Infiltration Given 11/05/22 1646)  Tdap (BOOSTRIX) injection 0.5 mL (0.5 mLs Intramuscular Given 11/05/22 1646)  ceFAZolin (ANCEF) IVPB 1 g/50 mL premix (0 g Intravenous Stopped 11/05/22 1926)  oxyCODONE-acetaminophen (PERCOCET/ROXICET) 5-325 MG per tablet 1 tablet (1 tablet Oral Given 11/05/22 1926)    ED Course/ Medical Decision Making/ A&P                                 Medical Decision Making Amount and/or Complexity of Data Reviewed Radiology: ordered.  Risk Prescription drug management.   57 y.o. male presents to the ED for concern of left pinky finger injury  Differential diagnosis includes but is not limited to fracture, dislocation, soft tissue injury, laceration, tendon injury  ED Course:  Patient presents with concern for left pinky injury he sustained when he is finger pushed into the guard rail when changing a car tire.  He presents with severe laceration to his left pinky finger extending from the medial side of the DIP to the distal aspect of the finger.  Distal pinky finger is without the nail and soft tissue is macerated.  Not able to visualize any bone protrusion.  He has full sensation of the finger.  Able to bend and extend at the DIP, PIP, MCP of the left hand 1st through 5th digits, no concern for tendon injury at this time.  X-rays show soft tissue injury and cortical irregularity along the distal tuft of the distal phalanx of the fifth digit. Wound irrigated  extensively with saline.  Eight 3-0 Prolene sutures were placed in the left little finger laceration.  The tip of the finger was not able to be closely approximated due to the extent of the injury, it was covered with sterile nonstick gauze and the finger was wrapped with a bulky dressing.  Abrasion on the top of his hand was dressed with nonstick gauze and covered with gauze. Patient given 1 g cefazolin IV due to the open nature of the wound  Patient was given 100 mcg fentanyl for pain initially and digital nerve block was performed for the left little finger Before leaving, patient was given 1 tablet Percocet for pain.  His wife accompanies him and will drive him home. Patient appropriate for discharge home with close follow-up in 7 to 10 days for suture removal and wound recheck.  He was shown how to dress the area here today and provided nonstick gauze.  He was provided with a finger splint to use to help protect the area.  Will be prescribed 10 days Augmentin for home and Percocet for breakthrough pain not controlled with ibuprofen or Tylenol.  Impression: Left middle finger laceration Left distal phalanx of fifth digit cortical irregularity  Disposition:  The patient was discharged home with instructions to follow-up in 7 to 10 days for suture removal, wound recheck.  Ibuprofen and Tylenol as needed for pain.  Percocet for pain through pain.  Augmentin for antibiotics Return precautions given.   Imaging Studies ordered: I ordered imaging studies including x-ray left hand I independently visualized the imaging with scope of interpretation limited to determining acute life threatening conditions related to emergency care. Imaging showed cortical irregularity of the distal tuft of the distal phalanx of the fifth digit I agree with the radiologist interpretation            Final Clinical Impression(s) / ED Diagnoses Final diagnoses:  Laceration of left little finger without foreign  body with damage to nail, sequela    Rx / DC Orders ED Discharge Orders          Ordered  amoxicillin-clavulanate (AUGMENTIN) 875-125 MG tablet  Every 12 hours        11/05/22 1916    oxyCODONE-acetaminophen (PERCOCET/ROXICET) 5-325 MG tablet  Every 6 hours PRN        11/05/22 1916              Arabella Merles, Cordelia Poche 11/05/22 1934    Cathren Laine, MD 11/06/22 1642
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
## 2022-12-30 IMAGING — RF DG TIBIA/FIBULA 2V*R*
1 series · 6 of 6 positions shown · IV contrast (agent unspecified)
Comparison: 07/09/2020

CLINICAL DATA: Right ankle fracture, external fixation,
intraoperative examination

EXAM:
DG C-ARM 1-60 MIN; RIGHT TIBIA AND FIBULA - 2 VIEW
CONTRAST:  None
FLUOROSCOPY TIME:  Fluoroscopy Time:  18 seconds
Radiation Exposure Index (if provided by the fluoroscopic device):
Not provided
Number of Acquired Spot Images: 6

[Series 1: run · 6 of 6 slices shown]
[im 1/6]
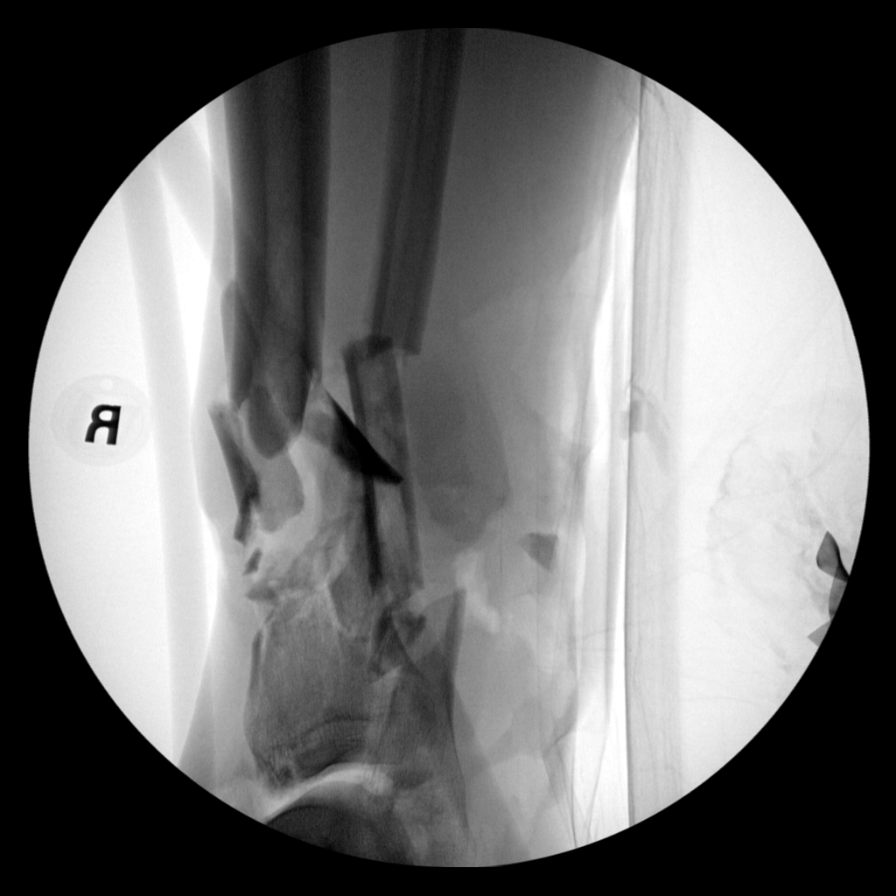
[im 2/6]
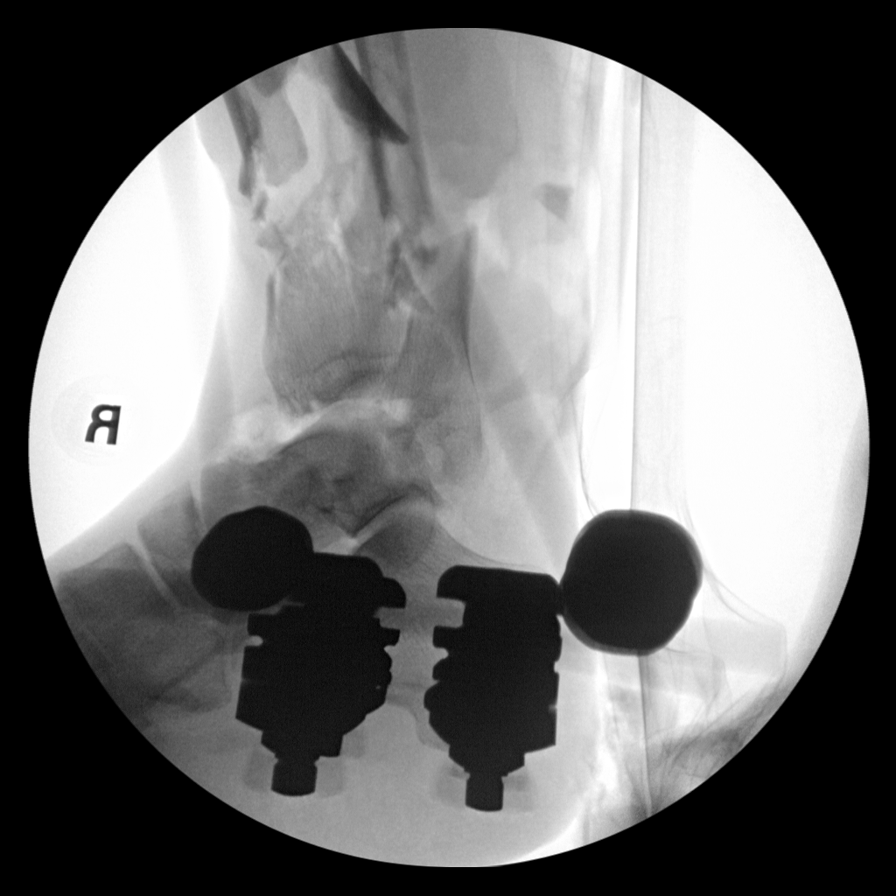
[im 3/6]
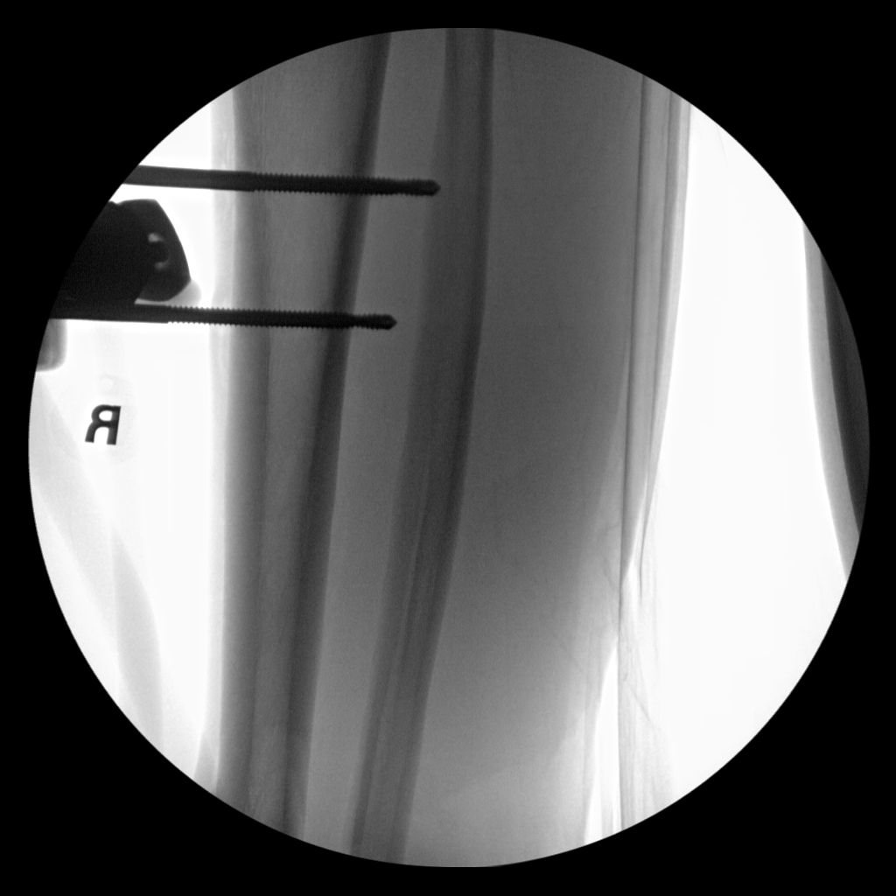
[im 4/6]
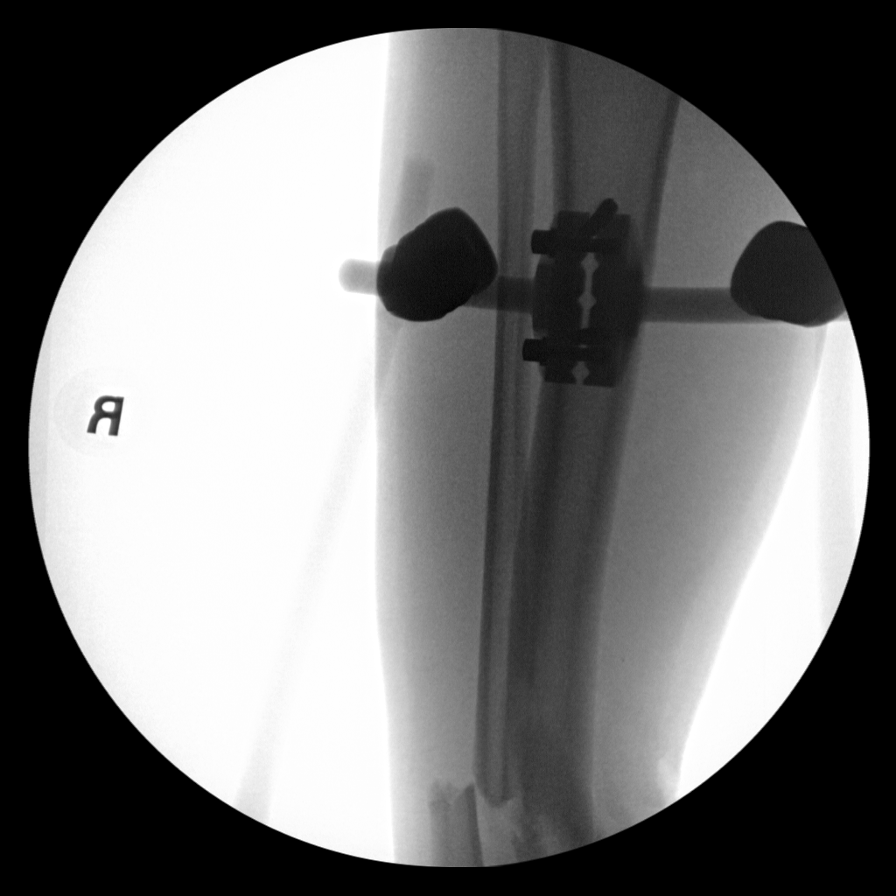
[im 5/6]
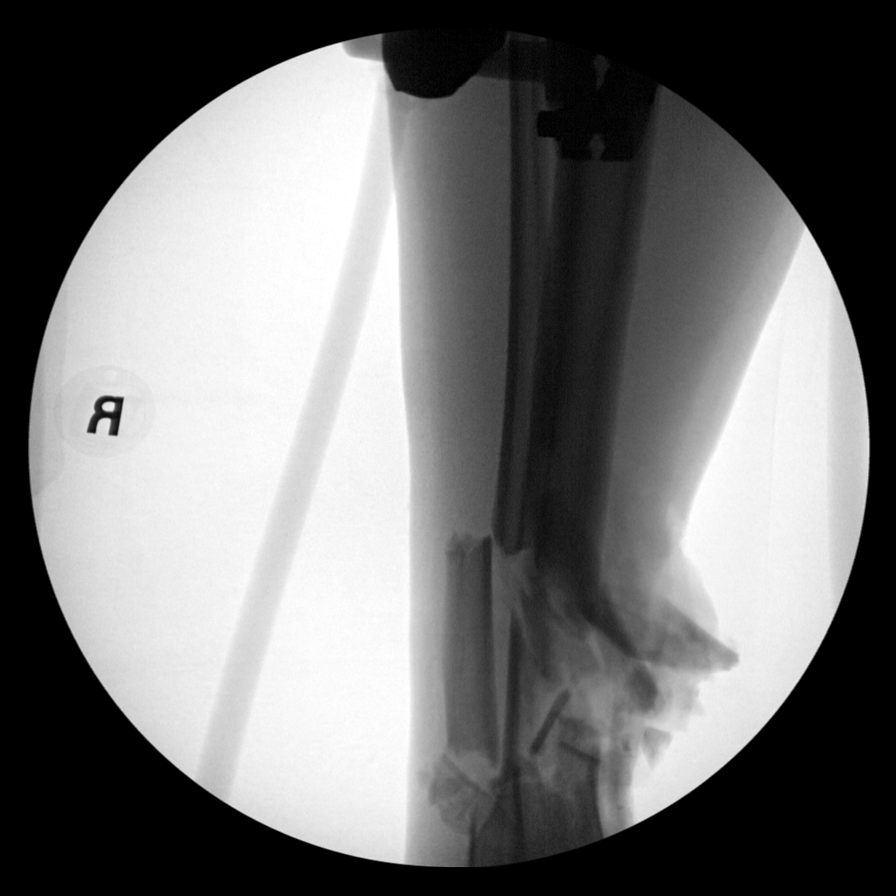
[im 6/6]
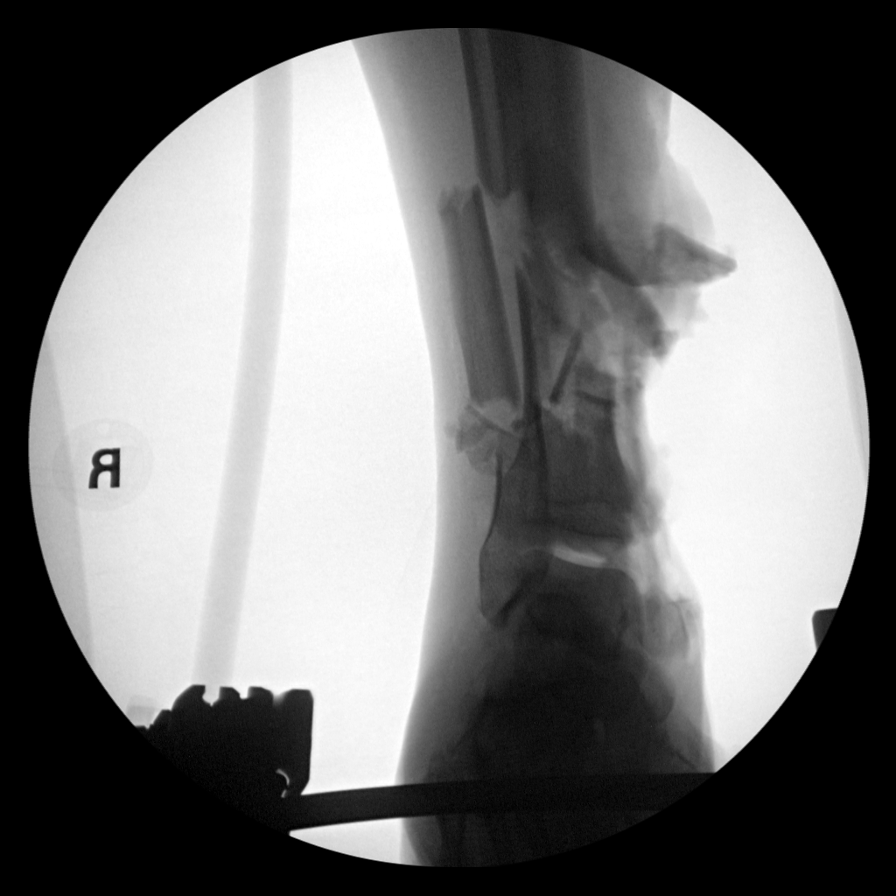

[6 of 6 positions shown; findings below may reference images not displayed]

FINDINGS: Six fluoroscopic intraoperative radiographs demonstrate application
of an external fixator from the proximal tibial diaphysis through
the posterior body of the calcaneus. Extensively comminuted open
fracture of the distal tibial and fibular diaphysis and medial
malleolus are again identified.
IMPRESSION: Right lower extremity external fixator application as described
above.

## 2022-12-30 IMAGING — DX DG TIBIA/FIBULA PORT 2V*R*
1 series · 2 of 2 positions shown · non-contrast
Comparison: CT 07/09/2020, radiographs and fluoroscopy 07/10/2020

CLINICAL DATA: Below-knee amputation

EXAM:
PORTABLE RIGHT TIBIA AND FIBULA - 2 VIEW

[Series 1: leg · 0.14mm/px · 2 of 2 slices shown]
[im 1/2]
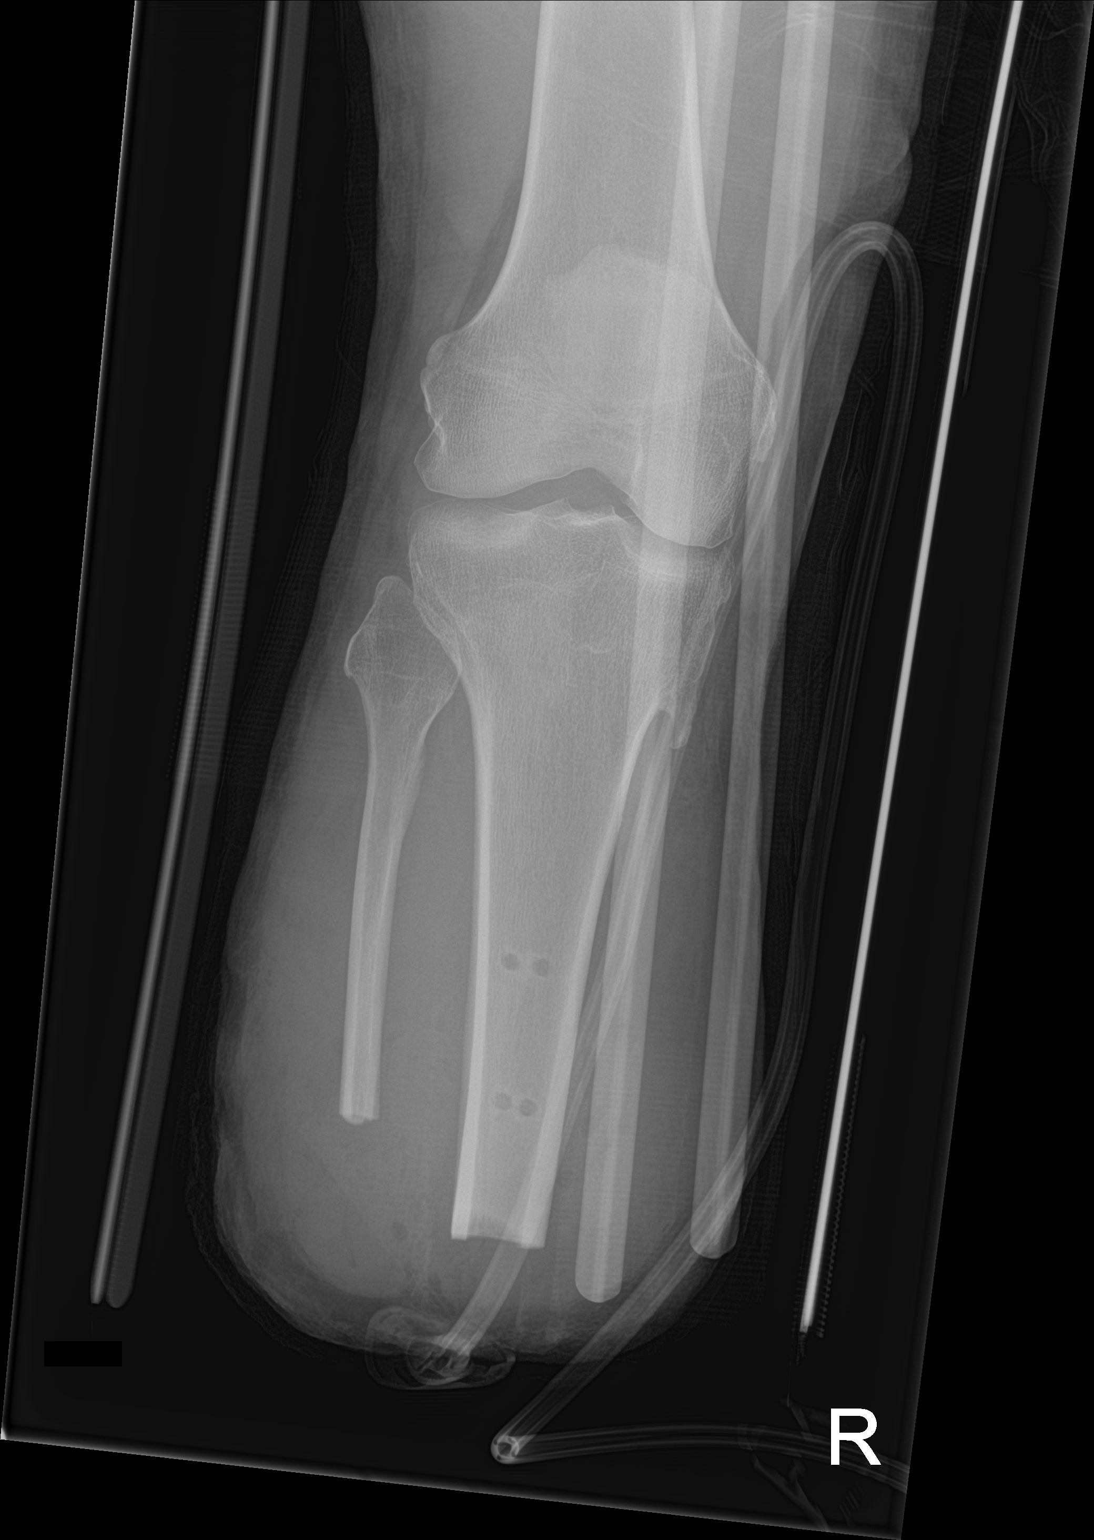
[im 2/2]
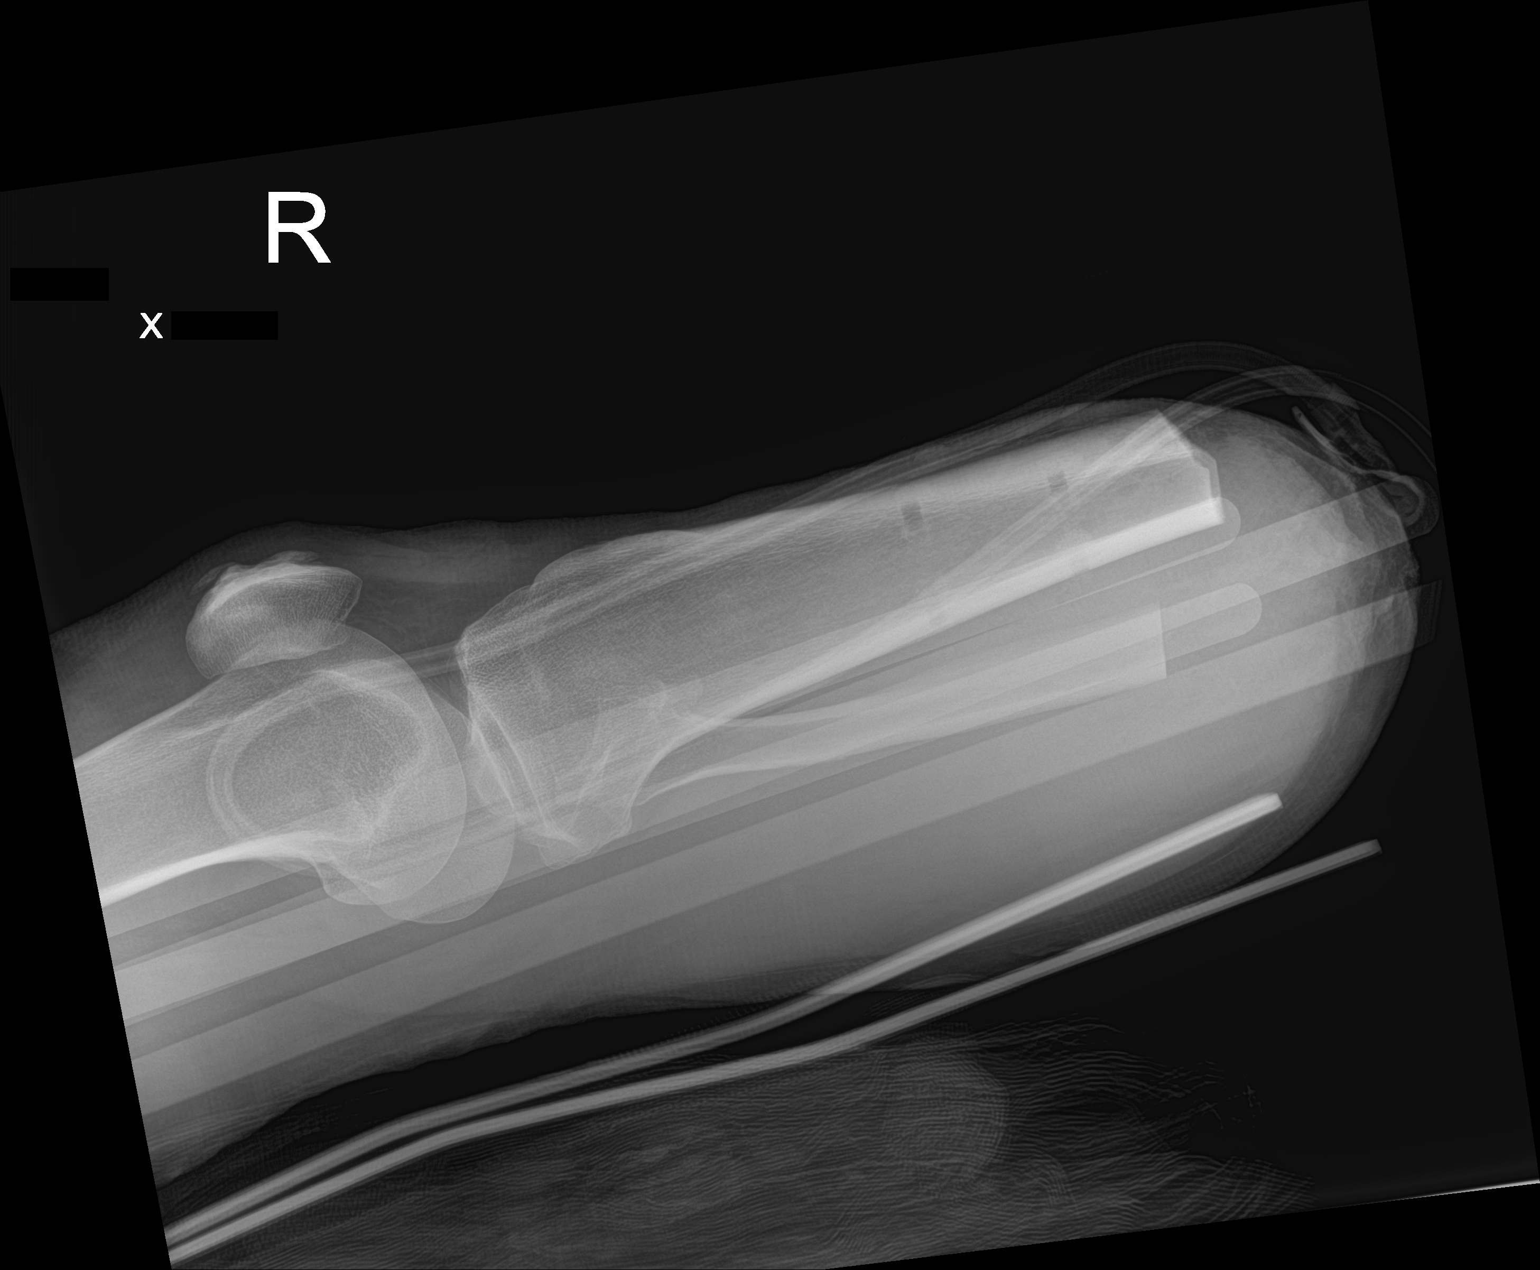

[2 of 2 positions shown; findings below may reference images not displayed]

FINDINGS: Postsurgical changes from a transtibial amputation of the right
lower extremity with expected postsurgical soft tissue changes and
overlying by dressing placed. No acute surgical complication is
seen. Some expected foci of soft tissue gas are present. Radiolucent
screw tracks are noted in the proximal tibial diaphysis. Spurring
along the medial tibial metaphysis is unchanged from comparison.
Additional mild tricompartmental degenerative changes of the knee.
IMPRESSION: Postsurgical changes from transtibial amputation of the right lower
extremity without acute complication. Vacuum dressing in place.

## 2022-12-30 IMAGING — CT CT FOOT*R* W/O CM
2 of 3 series · 13 of 27 positions shown, 16 images · non-contrast
Comparison: Radiographs 07/09/2020

CLINICAL DATA: Complex fractures.

EXAM:
CT OF THE RIGHT FOOT WITHOUT CONTRAST
TECHNIQUE: Multidetector CT imaging of the right foot was performed according
to the standard protocol. Multiplanar CT image reconstructions were
also generated.

[Series 4: extremity soft tissue · axial · 0.59mm/px · z∈[-1240,-1058]mm · 9 of 105 slices shown, 12 images]
[im 9/105  soft-tissue]
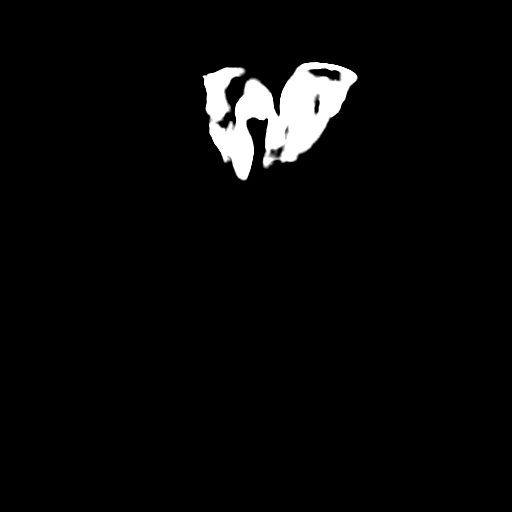
[im 9/105  bone]
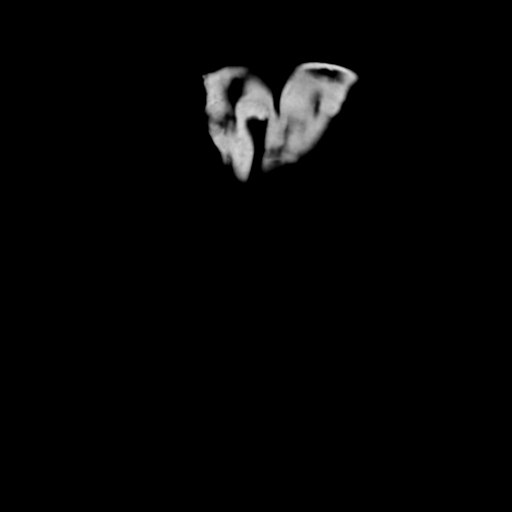
[im 25/105  bone]
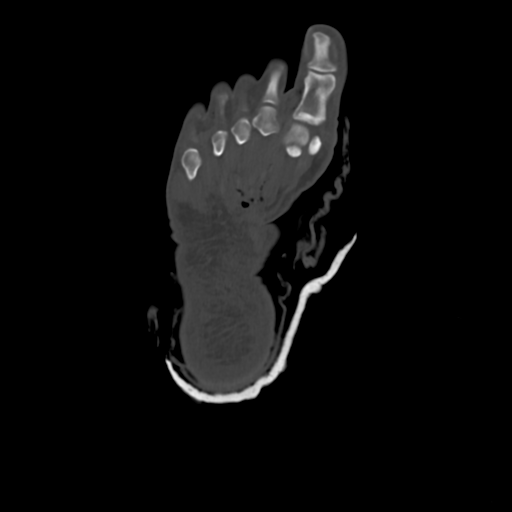
[im 33/105  bone]
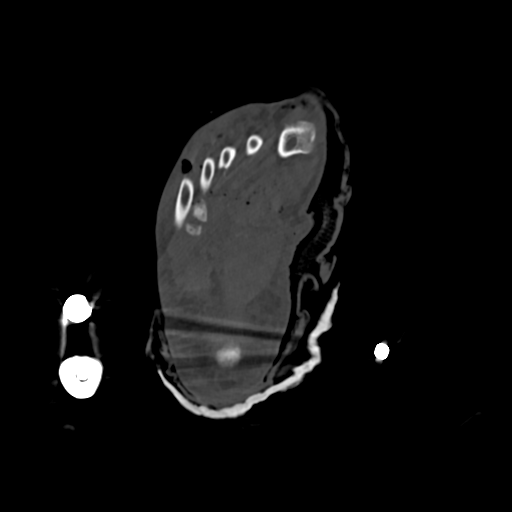
[im 41/105  bone]
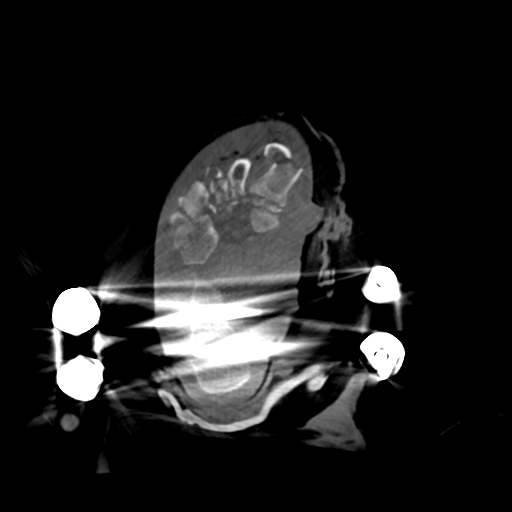
[im 57/105  soft-tissue]
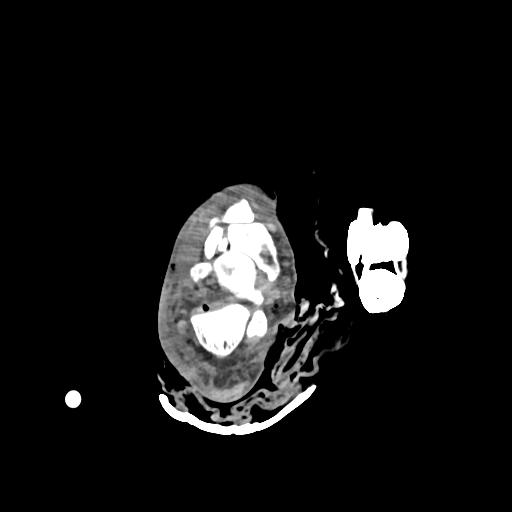
[im 57/105  bone]
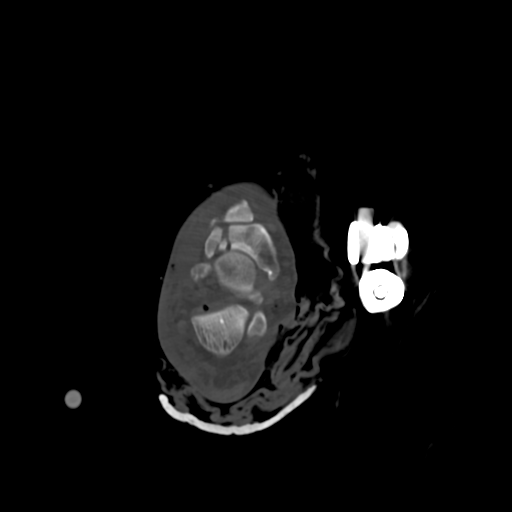
[im 65/105  bone]
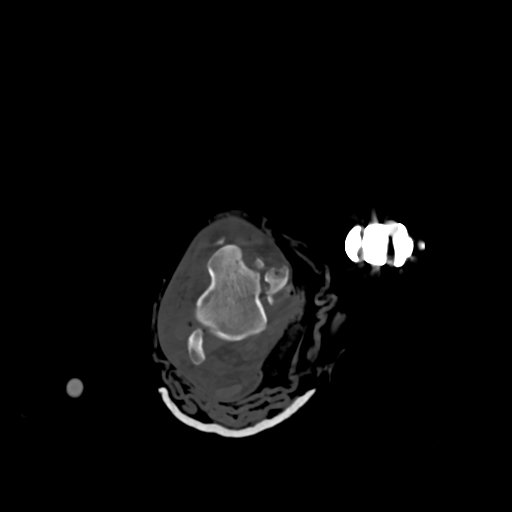
[im 73/105  bone]
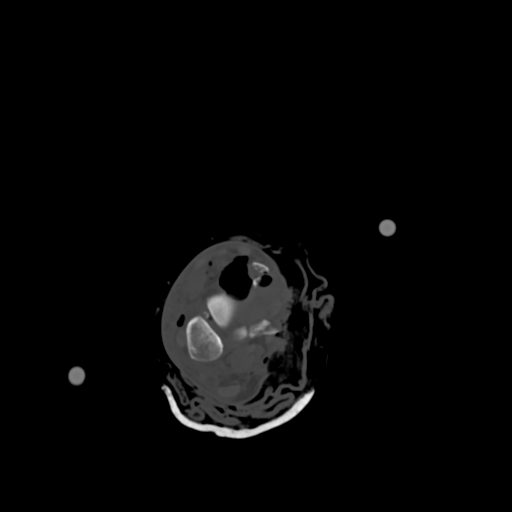
[im 89/105  bone]
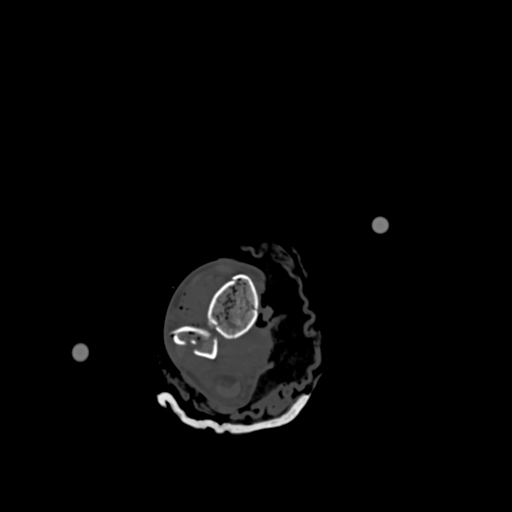
[im 97/105  soft-tissue]
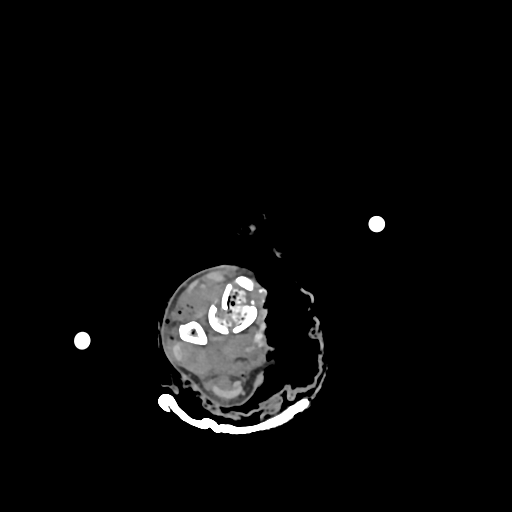
[im 97/105  bone]
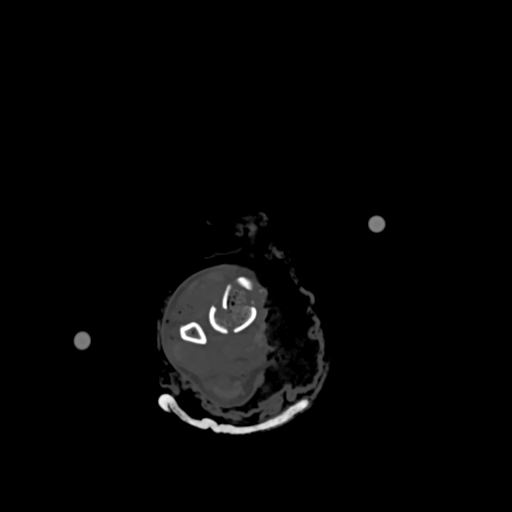

[Series 7: sag bone · sagittal · 0.40mm/px · 4 of 88 slices shown]
[im 18/88  bone]
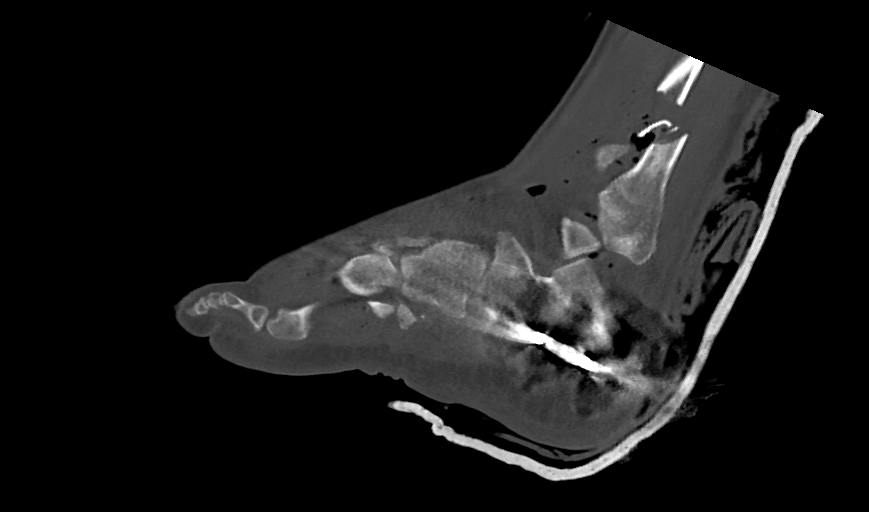
[im 35/88  bone]
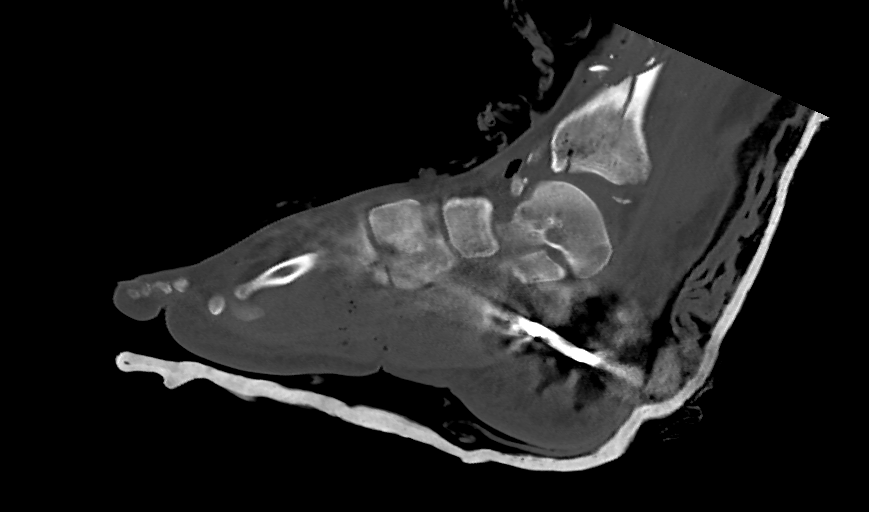
[im 53/88  bone]
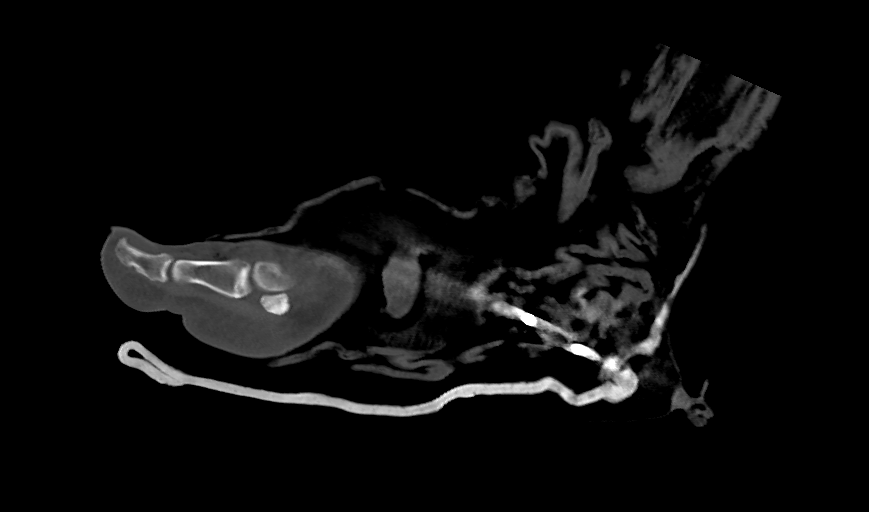
[im 70/88  bone]
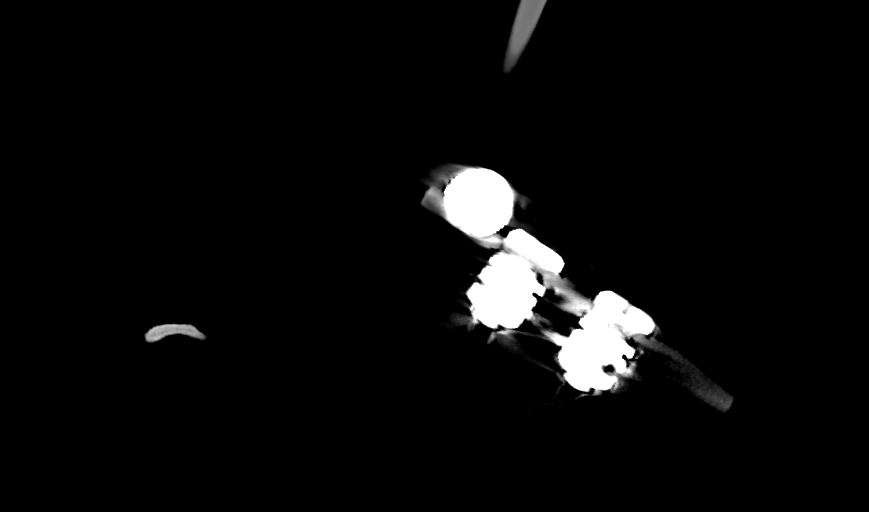

[13 of 27 positions shown; findings below may reference images not displayed]

FINDINGS: There is an external fixator in place with 2 screws traversing the
calcaneus.

Severely comminuted and displaced fractures of the distal tibia and
fibula with a large gaping open wound noted along the medial aspect
of the right lower extremity and ankle. Intra-articular fractures of
the distal tibia are noted with a small displaced posterior tibial
articular fracture in the joint.

There is a small cortical fracture involving the dorsal cortex of
the talus in the region of the talar neck but no through and through
fracture is identified. Large fracture fragments are noted along the
medial aspect of the upper talus but these are likely from the
medial malleolus fracture.

No definite calcaneal or cuboid fractures. There is a comminuted
longitudinal intra-articular fracture of the navicular bone with
maximum separation of approximately 6.5 mm. Some fragmentation along
the ulnar aspect.

The cuboid is intact.  The cuneiforms are intact.

Comminuted intra-articular fractures involving the metatarsal bases.
There are also proximal shaft fractures involving the first and
third metatarsals.

The MTP joints are maintained. No definite fractures of the
phalanges.

Extensive gas throughout the soft tissues suggesting open fractures.
IMPRESSION: 1. Severely comminuted and displaced fractures of the distal tibia
and fibula with a large gaping open wound along the medial aspect of
the right lower extremity and ankle.
2. Intra-articular fractures of the distal tibia with a small
displaced posterior tibial articular fracture fragment in the joint.
3. Small cortical fracture involving the dorsal cortex of the
navicular bone in the neck region but no through and through
fracture.
4. Comminuted longitudinal intra-articular fracture of the navicular
bone with maximum separation of approximately 6.5 mm.
5. Comminuted and displaced intra-articular fractures of the
metatarsal bases.
6. Mildly displaced fractures of the first and third proximal
metatarsal shafts.
7. Extensive gas throughout the soft tissues suggesting open
fractures.

## 2023-05-17 ENCOUNTER — Encounter: Payer: Self-pay | Admitting: Physician Assistant

## 2023-05-17 ENCOUNTER — Ambulatory Visit: Admitting: Physician Assistant

## 2023-05-17 VITALS — BP 160/90 | HR 77 | Temp 98.4°F | Ht 70.0 in | Wt 194.6 lb

## 2023-05-17 DIAGNOSIS — F1190 Opioid use, unspecified, uncomplicated: Secondary | ICD-10-CM | POA: Diagnosis not present

## 2023-05-17 DIAGNOSIS — Z131 Encounter for screening for diabetes mellitus: Secondary | ICD-10-CM

## 2023-05-17 DIAGNOSIS — D509 Iron deficiency anemia, unspecified: Secondary | ICD-10-CM

## 2023-05-17 DIAGNOSIS — Z89511 Acquired absence of right leg below knee: Secondary | ICD-10-CM | POA: Diagnosis not present

## 2023-05-17 DIAGNOSIS — T8733 Neuroma of amputation stump, right lower extremity: Secondary | ICD-10-CM

## 2023-05-17 DIAGNOSIS — R7989 Other specified abnormal findings of blood chemistry: Secondary | ICD-10-CM

## 2023-05-17 DIAGNOSIS — I10 Essential (primary) hypertension: Secondary | ICD-10-CM

## 2023-05-17 DIAGNOSIS — G479 Sleep disorder, unspecified: Secondary | ICD-10-CM

## 2023-05-17 DIAGNOSIS — M86661 Other chronic osteomyelitis, right tibia and fibula: Secondary | ICD-10-CM

## 2023-05-17 LAB — CBC WITH DIFFERENTIAL/PLATELET
Basophils Absolute: 0.1 10*3/uL (ref 0.0–0.1)
Basophils Relative: 1.1 % (ref 0.0–3.0)
Eosinophils Absolute: 0.4 10*3/uL (ref 0.0–0.7)
Eosinophils Relative: 4.6 % (ref 0.0–5.0)
HCT: 35.9 % — ABNORMAL LOW (ref 39.0–52.0)
Hemoglobin: 11.1 g/dL — ABNORMAL LOW (ref 13.0–17.0)
Lymphocytes Relative: 29.8 % (ref 12.0–46.0)
Lymphs Abs: 2.5 10*3/uL (ref 0.7–4.0)
MCHC: 30.9 g/dL (ref 30.0–36.0)
MCV: 68.7 fl — ABNORMAL LOW (ref 78.0–100.0)
Monocytes Absolute: 0.5 10*3/uL (ref 0.1–1.0)
Monocytes Relative: 6.2 % (ref 3.0–12.0)
Neutro Abs: 4.8 10*3/uL (ref 1.4–7.7)
Neutrophils Relative %: 58.3 % (ref 43.0–77.0)
Platelets: 329 10*3/uL (ref 150.0–400.0)
RBC: 5.22 Mil/uL (ref 4.22–5.81)
RDW: 19.9 % — ABNORMAL HIGH (ref 11.5–15.5)
WBC: 8.3 10*3/uL (ref 4.0–10.5)

## 2023-05-17 LAB — COMPREHENSIVE METABOLIC PANEL
ALT: 18 U/L (ref 0–53)
AST: 20 U/L (ref 0–37)
Albumin: 4.3 g/dL (ref 3.5–5.2)
Alkaline Phosphatase: 59 U/L (ref 39–117)
BUN: 22 mg/dL (ref 6–23)
CO2: 26 meq/L (ref 19–32)
Calcium: 9.3 mg/dL (ref 8.4–10.5)
Chloride: 106 meq/L (ref 96–112)
Creatinine, Ser: 1.02 mg/dL (ref 0.40–1.50)
GFR: 82.58 mL/min (ref >60.00)
Glucose, Bld: 82 mg/dL (ref 70–99)
Potassium: 3.9 meq/L (ref 3.5–5.1)
Sodium: 140 meq/L (ref 135–145)
Total Bilirubin: 0.3 mg/dL (ref 0.2–1.2)
Total Protein: 7.1 g/dL (ref 6.0–8.3)

## 2023-05-17 LAB — TSH: TSH: 2.57 u[IU]/mL (ref 0.35–5.50)

## 2023-05-17 LAB — IBC + FERRITIN
Ferritin: 4.1 ng/mL — ABNORMAL LOW (ref 22.0–322.0)
Iron: 21 ug/dL — ABNORMAL LOW (ref 42–165)
Saturation Ratios: 4.7 % — ABNORMAL LOW (ref 20.0–50.0)
TIBC: 443.8 ug/dL (ref 250.0–450.0)
Transferrin: 317 mg/dL (ref 212.0–360.0)

## 2023-05-17 LAB — HEMOGLOBIN A1C: Hgb A1c MFr Bld: 6.1 % (ref 4.6–6.5)
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
## 2023-05-19 ENCOUNTER — Encounter: Payer: Self-pay | Admitting: Physician Assistant

## 2023-05-20 ENCOUNTER — Other Ambulatory Visit: Payer: Self-pay | Admitting: Physician Assistant

## 2023-05-20 MED ORDER — DICLOFENAC SODIUM 3 % EX GEL: 2.0000 g | Freq: Two times a day (BID) | CUTANEOUS | 0 refills | Status: AC

## 2023-05-20 NOTE — Telephone Encounter (Signed)
 Please see patient refill requests and message

## 2023-05-20 NOTE — Telephone Encounter (Signed)
 Sent Rx refill for gel however unable to deny other request due to being a controlled substance. Provider denial is required

## 2023-05-31 ENCOUNTER — Ambulatory Visit: Admitting: Physician Assistant

## 2023-07-01 ENCOUNTER — Other Ambulatory Visit: Payer: Self-pay

## 2023-09-01 ENCOUNTER — Other Ambulatory Visit: Payer: Self-pay

## 2023-09-01 ENCOUNTER — Telehealth: Payer: Self-pay

## 2023-09-01 NOTE — Telephone Encounter (Signed)
 Called pt and lvm to call office; pt needing to schedule AWV either in person or Phone with [facility]

## 2024-01-03 ENCOUNTER — Telehealth: Payer: Self-pay

## 2024-01-04 ENCOUNTER — Telehealth: Payer: Self-pay | Admitting: Physician Assistant

## 2024-01-04 NOTE — Telephone Encounter (Signed)
 Copied from CRM #[id number]. Topic: Referral - Status >> Jan 04, 2024 10:59 AM Berneda FALCON wrote: Reason for CRM: Patient states referral for pain management was supposed to have been sent over months ago. I do see that it was sent by Alyssa Allwardt back in March 2025, but they are stating they never received it. Patient has been waiting months for this.  It was to go to [facility] on [address] [place] [address] [place], [address] Phone number is [phone] (for referral PCP)  Can we please double check on this for him? He states he talked to [facility] about this yesterday and has not received a response as of yet.  Patient callback is [phone]

## 2024-01-04 NOTE — Telephone Encounter (Signed)
 Opened in error

## 2024-01-04 NOTE — Telephone Encounter (Signed)
 Spoke with patient 01/03/24 and advised referral status on our end as well as phone number to call and schedule appt according to MyChart letter sent to pt; Since this has been since March, office has likely closed this referral and will need to be resent. Ok to send new referral?

## 2024-01-05 NOTE — Telephone Encounter (Signed)
 Pt came in office and scheduled an appt to see PCP 01/09/24. Would you still like me to place referral or wait for appt?

## 2024-01-05 NOTE — Telephone Encounter (Signed)
 Noted

## 2024-01-06 NOTE — Telephone Encounter (Signed)
 Please advise on if patient can receive a referral to a new pain clinic: Preference of office, provider, location: [facility] [address] Rut[address] Tetherow Dr. Jama Pereyra  Copied from CRM [id number]. Topic: Referral - Request for Referral >> Jan 06, 2024  1:38 PM Carlyon D wrote: Did the patient discuss referral with their provider in the last year? Yes (If No - schedule appointment) (If Yes - send message)  Appointment offered? Yes  Type of order/referral and detailed reason for visit: pain Dr.   Preference of office, provider, location: [facility] [place] [address] Ruthellen Castalia  Dr. Jama Pereyra   If referral order, have you been seen by this specialty before? Yes (If Yes, this issue or another issue? When? Where?  Can we respond through MyChart? Yes but prefers phone call >> Jan 06, 2024  2:15 PM Rea ORN wrote: Pt called to add an additional provider to this request: Dr. Darlis, [address]

## 2024-01-09 ENCOUNTER — Ambulatory Visit: Admitting: Physician Assistant

## 2024-01-09 NOTE — Telephone Encounter (Signed)
 Noted, pt has appt today

## 2024-01-10 ENCOUNTER — Telehealth: Payer: Self-pay

## 2024-01-10 NOTE — Telephone Encounter (Signed)
 Copied from CRM [id number]. Topic: General - Other >> Jan 09, 2024  4:57 PM Alfonso HERO wrote: Reason for CRM: patient is requesting to speak to [place] or Harve Spradley. Didn't give me any details. Says they are familiar with what he has going on in regards to a referral. Requesting a call back.   Returned pt call and scheduled pt for appt with PCP for Thursday to discuss referrals and for follow up pt missed from April

## 2024-01-12 ENCOUNTER — Ambulatory Visit: Admitting: Physician Assistant

## 2024-01-12 ENCOUNTER — Encounter: Payer: Self-pay | Admitting: Physician Assistant

## 2024-01-13 ENCOUNTER — Encounter: Admitting: Physician Assistant

## 2024-01-13 ENCOUNTER — Telehealth: Payer: Self-pay | Admitting: Physician Assistant

## 2024-01-13 NOTE — Telephone Encounter (Signed)
 Patient missed appointment on 01/12/2024.  Patient was insistent on coming to the office.  I spoke with the patient once he arrived.  I have shared with him that he has now missed 3 appointments.  I have advised for him to ensure he keeps his appointments and if he is unable to cancel 24 hours in advance.   Patient states he relies on his ex wife for transportation. I have advised patient that his insurance also will provide him with transportation as well. Patient wanted to follow up on referral to pain management from March.  States he never received a call.  I have asked for the referral team to follow up in regard.

## 2024-01-16 ENCOUNTER — Encounter: Payer: Self-pay | Admitting: Physician Assistant

## 2024-01-26 ENCOUNTER — Ambulatory Visit

## 2024-02-01 ENCOUNTER — Telehealth: Payer: Self-pay | Admitting: Physician Assistant

## 2024-02-01 NOTE — Telephone Encounter (Signed)
 Patient states current referral to pain management is still in review and is not sure facility will take him on.  Patient is requesting another referral to be entered to [facility].   Address:   [address] [place]  [address] PH# [phone] Fax#[phone] (patient states/ referrals please verify)

## 2024-02-02 ENCOUNTER — Other Ambulatory Visit: Payer: Self-pay

## 2024-02-02 DIAGNOSIS — T8733 Neuroma of amputation stump, right lower extremity: Secondary | ICD-10-CM

## 2024-02-02 DIAGNOSIS — M86661 Other chronic osteomyelitis, right tibia and fibula: Secondary | ICD-10-CM

## 2024-02-02 DIAGNOSIS — M62838 Other muscle spasm: Secondary | ICD-10-CM

## 2024-02-03 ENCOUNTER — Telehealth: Payer: Self-pay

## 2024-02-03 NOTE — Telephone Encounter (Signed)
 Noted

## 2024-02-03 NOTE — Telephone Encounter (Signed)
 Copied from CRM #[id number]. Topic: Referral - Question >> Feb 03, 2024 10:23 AM Mercedes MATSU wrote: Reason for CRM: Patient called in asking to speak with Tammy P in regards to a referral. He said he can be reached at [phone] for pain management.  Please see pt msg requesting to speak with Tammy directly regarding referral for pain management. Looks like pt called 02/01/24 and new referral placed per msg at location pt requested. Pt scheduled to see PCP 02/06/24.

## 2024-02-06 ENCOUNTER — Encounter: Payer: Self-pay | Admitting: Physician Assistant

## 2024-02-06 ENCOUNTER — Telehealth: Payer: Self-pay | Admitting: Physician Assistant

## 2024-02-06 ENCOUNTER — Other Ambulatory Visit: Payer: Self-pay | Admitting: Physician Assistant

## 2024-02-06 ENCOUNTER — Ambulatory Visit: Admitting: Physician Assistant

## 2024-02-06 VITALS — BP 156/98 | HR 99 | Temp 97.3°F | Ht 70.0 in | Wt 194.2 lb

## 2024-02-06 DIAGNOSIS — F919 Conduct disorder, unspecified: Secondary | ICD-10-CM

## 2024-02-06 DIAGNOSIS — J432 Centrilobular emphysema: Secondary | ICD-10-CM

## 2024-02-06 DIAGNOSIS — I10 Essential (primary) hypertension: Secondary | ICD-10-CM | POA: Insufficient documentation

## 2024-02-06 DIAGNOSIS — M869 Osteomyelitis, unspecified: Secondary | ICD-10-CM

## 2024-02-06 DIAGNOSIS — D509 Iron deficiency anemia, unspecified: Secondary | ICD-10-CM

## 2024-02-06 DIAGNOSIS — R7303 Prediabetes: Secondary | ICD-10-CM | POA: Insufficient documentation

## 2024-02-06 DIAGNOSIS — G894 Chronic pain syndrome: Secondary | ICD-10-CM

## 2024-02-06 DIAGNOSIS — R911 Solitary pulmonary nodule: Secondary | ICD-10-CM

## 2024-02-06 DIAGNOSIS — F17210 Nicotine dependence, cigarettes, uncomplicated: Secondary | ICD-10-CM

## 2024-02-06 DIAGNOSIS — T8733 Neuroma of amputation stump, right lower extremity: Secondary | ICD-10-CM

## 2024-02-06 DIAGNOSIS — F1120 Opioid dependence, uncomplicated: Secondary | ICD-10-CM

## 2024-02-06 MED ORDER — OLMESARTAN MEDOXOMIL 20 MG PO TABS: 20.0000 mg | ORAL_TABLET | Freq: Every day | ORAL | 1 refills | Status: AC
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
## 2024-02-06 NOTE — Patient Instructions (Signed)
 Schedule AWV with [place] next available

## 2024-02-06 NOTE — Telephone Encounter (Signed)
 See phone note

## 2024-02-07 ENCOUNTER — Telehealth: Payer: Self-pay | Admitting: Physician Assistant

## 2024-02-07 NOTE — Telephone Encounter (Signed)
 We find it necessary to inform you that [facility] and all [facility] Primary Care Practices/Providers will no longer be able to provide medical care to you. We believe that our patient/provider relationship has been compromised and thus would request that you seek care elsewhere.  02/06/24

## 2024-02-20 ENCOUNTER — Other Ambulatory Visit (HOSPITAL_COMMUNITY): Payer: Self-pay

## 2024-02-21 ENCOUNTER — Other Ambulatory Visit: Payer: Self-pay

## 2024-02-21 ENCOUNTER — Other Ambulatory Visit (HOSPITAL_COMMUNITY): Payer: Self-pay

## 2024-02-24 ENCOUNTER — Other Ambulatory Visit (HOSPITAL_COMMUNITY): Payer: Self-pay

## 2024-02-24 MED ORDER — OXYCODONE-ACETAMINOPHEN 7.5-325 MG PO TABS
1.0000 | ORAL_TABLET | Freq: Three times a day (TID) | ORAL | 0 refills | Status: AC
  Filled 2024-02-24: qty 21, 7d supply, fill #0

## 2024-02-25 ENCOUNTER — Other Ambulatory Visit (HOSPITAL_COMMUNITY): Payer: Self-pay

## 2024-02-27 ENCOUNTER — Other Ambulatory Visit (HOSPITAL_COMMUNITY): Payer: Self-pay

## 2024-03-07 ENCOUNTER — Encounter

## 2024-03-07 NOTE — Progress Notes (Signed)
 Pt stated he is now a pt of Dr Alen no longer with [facility]

## 2024-03-07 NOTE — Patient Instructions (Addendum)
 SABRA
